# Patient Record
Sex: Male | Born: 1935
Health system: Southern US, Community
[De-identification: ages and names within clinical notes are randomized; demographics above are authoritative.]

## PROBLEM LIST (undated history)

## (undated) DIAGNOSIS — N201 Calculus of ureter: Secondary | ICD-10-CM

## (undated) DIAGNOSIS — M5430 Sciatica, unspecified side: Secondary | ICD-10-CM

## (undated) DIAGNOSIS — E119 Type 2 diabetes mellitus without complications: Secondary | ICD-10-CM

## (undated) DIAGNOSIS — N529 Male erectile dysfunction, unspecified: Secondary | ICD-10-CM

## (undated) DIAGNOSIS — Z860100 Personal history of colon polyps, unspecified: Secondary | ICD-10-CM

## (undated) DIAGNOSIS — G629 Polyneuropathy, unspecified: Secondary | ICD-10-CM

## (undated) DIAGNOSIS — M109 Gout, unspecified: Secondary | ICD-10-CM

## (undated) DIAGNOSIS — R351 Nocturia: Secondary | ICD-10-CM

## (undated) HISTORY — DX: Gout, unspecified: M10.9

## (undated) HISTORY — PX: LUMBAR DISC SURGERY: SHX700

## (undated) HISTORY — DX: Sciatica, unspecified side: M54.30

## (undated) HISTORY — DX: Male erectile dysfunction, unspecified: N52.9

## (undated) HISTORY — DX: Polyneuropathy, unspecified: G62.9

## (undated) HISTORY — PX: COLONOSCOPY: SHX174

---

## 2002-03-26 ENCOUNTER — Ambulatory Visit (HOSPITAL_COMMUNITY): Admission: RE | Admit: 2002-03-26 | Discharge: 2002-03-26 | Payer: Self-pay | Admitting: Internal Medicine

## 2003-01-27 ENCOUNTER — Ambulatory Visit (HOSPITAL_COMMUNITY): Admission: RE | Admit: 2003-01-27 | Discharge: 2003-01-27 | Payer: Self-pay | Admitting: Family Medicine

## 2011-06-06 DIAGNOSIS — G589 Mononeuropathy, unspecified: Secondary | ICD-10-CM | POA: Diagnosis not present

## 2011-06-06 DIAGNOSIS — E1149 Type 2 diabetes mellitus with other diabetic neurological complication: Secondary | ICD-10-CM | POA: Diagnosis not present

## 2011-06-06 DIAGNOSIS — E119 Type 2 diabetes mellitus without complications: Secondary | ICD-10-CM | POA: Diagnosis not present

## 2011-06-06 DIAGNOSIS — M109 Gout, unspecified: Secondary | ICD-10-CM | POA: Diagnosis not present

## 2011-06-09 DIAGNOSIS — Z79899 Other long term (current) drug therapy: Secondary | ICD-10-CM | POA: Diagnosis not present

## 2011-06-09 DIAGNOSIS — I10 Essential (primary) hypertension: Secondary | ICD-10-CM | POA: Diagnosis not present

## 2011-11-03 DIAGNOSIS — D234 Other benign neoplasm of skin of scalp and neck: Secondary | ICD-10-CM | POA: Diagnosis not present

## 2011-11-03 DIAGNOSIS — D0460 Carcinoma in situ of skin of unspecified upper limb, including shoulder: Secondary | ICD-10-CM | POA: Diagnosis not present

## 2011-11-03 DIAGNOSIS — L570 Actinic keratosis: Secondary | ICD-10-CM | POA: Diagnosis not present

## 2011-11-03 DIAGNOSIS — D0439 Carcinoma in situ of skin of other parts of face: Secondary | ICD-10-CM | POA: Diagnosis not present

## 2011-11-03 DIAGNOSIS — D485 Neoplasm of uncertain behavior of skin: Secondary | ICD-10-CM | POA: Diagnosis not present

## 2011-11-03 DIAGNOSIS — D044 Carcinoma in situ of skin of scalp and neck: Secondary | ICD-10-CM | POA: Diagnosis not present

## 2011-11-29 DIAGNOSIS — E119 Type 2 diabetes mellitus without complications: Secondary | ICD-10-CM | POA: Diagnosis not present

## 2011-11-29 DIAGNOSIS — K0510 Chronic gingivitis, plaque induced: Secondary | ICD-10-CM | POA: Diagnosis not present

## 2011-11-29 DIAGNOSIS — I10 Essential (primary) hypertension: Secondary | ICD-10-CM | POA: Diagnosis not present

## 2011-11-29 DIAGNOSIS — Z23 Encounter for immunization: Secondary | ICD-10-CM | POA: Diagnosis not present

## 2011-11-29 DIAGNOSIS — M109 Gout, unspecified: Secondary | ICD-10-CM | POA: Diagnosis not present

## 2012-01-30 DIAGNOSIS — E119 Type 2 diabetes mellitus without complications: Secondary | ICD-10-CM | POA: Diagnosis not present

## 2012-01-30 DIAGNOSIS — H2510 Age-related nuclear cataract, unspecified eye: Secondary | ICD-10-CM | POA: Diagnosis not present

## 2012-01-30 DIAGNOSIS — Z961 Presence of intraocular lens: Secondary | ICD-10-CM | POA: Diagnosis not present

## 2012-02-27 DIAGNOSIS — L570 Actinic keratosis: Secondary | ICD-10-CM | POA: Diagnosis not present

## 2012-04-29 ENCOUNTER — Other Ambulatory Visit: Payer: Self-pay | Admitting: Family Medicine

## 2012-05-15 ENCOUNTER — Other Ambulatory Visit: Payer: Self-pay | Admitting: Family Medicine

## 2012-05-18 ENCOUNTER — Encounter: Payer: Self-pay | Admitting: *Deleted

## 2012-05-20 ENCOUNTER — Ambulatory Visit (INDEPENDENT_AMBULATORY_CARE_PROVIDER_SITE_OTHER): Payer: Medicare Other | Admitting: Family Medicine

## 2012-05-20 ENCOUNTER — Encounter: Payer: Self-pay | Admitting: Family Medicine

## 2012-05-20 VITALS — BP 138/90 | HR 70 | Wt 228.0 lb

## 2012-05-20 DIAGNOSIS — Z79899 Other long term (current) drug therapy: Secondary | ICD-10-CM | POA: Diagnosis not present

## 2012-05-20 DIAGNOSIS — E1149 Type 2 diabetes mellitus with other diabetic neurological complication: Secondary | ICD-10-CM | POA: Diagnosis not present

## 2012-05-20 DIAGNOSIS — N400 Enlarged prostate without lower urinary tract symptoms: Secondary | ICD-10-CM | POA: Diagnosis not present

## 2012-05-20 DIAGNOSIS — I10 Essential (primary) hypertension: Secondary | ICD-10-CM | POA: Diagnosis not present

## 2012-05-20 DIAGNOSIS — E119 Type 2 diabetes mellitus without complications: Secondary | ICD-10-CM | POA: Diagnosis not present

## 2012-05-20 DIAGNOSIS — E1169 Type 2 diabetes mellitus with other specified complication: Secondary | ICD-10-CM | POA: Insufficient documentation

## 2012-05-20 DIAGNOSIS — M109 Gout, unspecified: Secondary | ICD-10-CM

## 2012-05-20 LAB — POCT GLYCOSYLATED HEMOGLOBIN (HGB A1C): Hemoglobin A1C: 6.2

## 2012-05-20 NOTE — Progress Notes (Signed)
  Subjective:    Patient ID: Daniel Mercado, male    DOB: [birth year], 79 y.o.   MRN: [id number]  Diabetes He presents for his follow-up diabetic visit. He has type 2 diabetes mellitus. His disease course has been stable. Pertinent negatives for hypoglycemia include no confusion, dizziness or headaches. Pertinent negatives for diabetes include no blurred vision. Symptoms are stable. Current diabetic treatment includes diet. He is compliant with treatment most of the time. He participates in exercise daily. There is no change in his home blood glucose trend. His breakfast blood glucose is taken between 7-8 am. His breakfast blood glucose range is generally 90-110 mg/dl. He does not see a podiatrist.Eye exam is current.   Patient states no further gout attacks on medication.  Patient feels the Flomax has not helped him much at this point. He also notes a swelling around his eyes which he seems to feel is due to this medication.  Review of Systems  Eyes: Negative for blurred vision.  Neurological: Negative for dizziness and headaches.  Psychiatric/Behavioral: Negative for confusion.   ROS otherwise negative    Objective:   Physical Exam  Results for orders placed in visit on 05/20/12  POCT GLYCOSYLATED HEMOGLOBIN (HGB A1C)      Result Value Range   Hemoglobin A1C 6.2      Alert no acute distress. Vitals reviewed. HEENT normal. Lungs clear. Heart regular rate and rhythm. Feet distal sensation diminished. Pulses good no edema no deformity    Assessment & Plan:  Impression #1 type 2 diabetes overall good control. #2 sensory neuropathy secondary to diabetes. #3 gout no recurrence. #4 prostate hypertrophy discussed. #5 proceed symptoms from Flomax. Plan stop Flomax. Maintain other meds. Diet exercise discussed. Appropriate blood work. Recheck in 6 months.

## 2012-05-27 DIAGNOSIS — I10 Essential (primary) hypertension: Secondary | ICD-10-CM | POA: Diagnosis not present

## 2012-05-27 DIAGNOSIS — Z79899 Other long term (current) drug therapy: Secondary | ICD-10-CM | POA: Diagnosis not present

## 2012-05-27 DIAGNOSIS — E119 Type 2 diabetes mellitus without complications: Secondary | ICD-10-CM | POA: Diagnosis not present

## 2012-05-27 LAB — BASIC METABOLIC PANEL
CO2: 25 mEq/L (ref 19–32)
Calcium: 9.3 mg/dL (ref 8.4–10.5)
Creat: 1.16 mg/dL (ref 0.50–1.35)

## 2012-05-27 LAB — HEPATIC FUNCTION PANEL
AST: 24 U/L (ref 0–37)
Albumin: 3.9 g/dL (ref 3.5–5.2)
Alkaline Phosphatase: 52 U/L (ref 39–117)
Total Bilirubin: 0.7 mg/dL (ref 0.3–1.2)

## 2012-05-27 LAB — LIPID PANEL
HDL: 42 mg/dL (ref >39)
Triglycerides: 59 mg/dL (ref <150)

## 2012-06-02 ENCOUNTER — Encounter: Payer: Self-pay | Admitting: Family Medicine

## 2012-07-29 ENCOUNTER — Other Ambulatory Visit: Payer: Self-pay | Admitting: Family Medicine

## 2012-09-10 ENCOUNTER — Telehealth: Payer: Self-pay | Admitting: Family Medicine

## 2012-09-10 NOTE — Telephone Encounter (Signed)
Needs a new prescription test strips for Easy Max Glucose Meter.  Also needs the lancets for this meter. [employer] he is completely out of Test Strips.  Please call Patient. Thanks

## 2012-09-10 NOTE — Telephone Encounter (Signed)
RX faxed to [facility]. Patient's wife was notified.

## 2012-10-30 ENCOUNTER — Other Ambulatory Visit: Payer: Self-pay | Admitting: Family Medicine

## 2012-11-19 ENCOUNTER — Other Ambulatory Visit: Payer: Self-pay | Admitting: Family Medicine

## 2012-12-04 ENCOUNTER — Other Ambulatory Visit: Payer: Self-pay | Admitting: Family Medicine

## 2012-12-24 ENCOUNTER — Encounter (INDEPENDENT_AMBULATORY_CARE_PROVIDER_SITE_OTHER): Payer: Self-pay | Admitting: *Deleted

## 2012-12-24 ENCOUNTER — Encounter (INDEPENDENT_AMBULATORY_CARE_PROVIDER_SITE_OTHER): Payer: Self-pay

## 2013-01-02 ENCOUNTER — Encounter: Payer: Self-pay | Admitting: Family Medicine

## 2013-01-02 ENCOUNTER — Ambulatory Visit (INDEPENDENT_AMBULATORY_CARE_PROVIDER_SITE_OTHER): Payer: Medicare Other | Admitting: Family Medicine

## 2013-01-02 VITALS — BP 130/86 | Ht 72.0 in | Wt 226.2 lb

## 2013-01-02 DIAGNOSIS — Z23 Encounter for immunization: Secondary | ICD-10-CM | POA: Diagnosis not present

## 2013-01-02 DIAGNOSIS — E119 Type 2 diabetes mellitus without complications: Secondary | ICD-10-CM | POA: Diagnosis not present

## 2013-01-02 DIAGNOSIS — Z0000 Encounter for general adult medical examination without abnormal findings: Secondary | ICD-10-CM

## 2013-01-02 MED ORDER — GLIPIZIDE ER 2.5 MG PO TB24: 2.5000 mg | ORAL_TABLET | Freq: Every day | ORAL | Status: DC

## 2013-01-02 NOTE — Progress Notes (Signed)
Subjective:    Patient ID: Daniel Mercado, male    DOB: March 03, 1931, 79 y.o.   MRN: [id number]  HPI  Patient arrives for his yearly wellness exam. Patient has had no falls and passed mini cog exam.  Just on 100 mg allopurinol and so far working. No further bouts with gallop thankfully.  Claims compliance with medication. Note sugar overall good control. Trying to watch his diet. Compliant with medicines.  Needs a flus hsot.  Results for orders placed in visit on 05/20/12  LIPID PANEL      Result Value Range   Cholesterol 128  0 - 200 mg/dL   Triglycerides 59  <034 mg/dL   HDL 42  >74 mg/dL   Total CHOL/HDL Ratio 3.0     VLDL 12  0 - 40 mg/dL   LDL Cholesterol 74  0 - 99 mg/dL  HEPATIC FUNCTION PANEL      Result Value Range   Total Bilirubin 0.7  0.3 - 1.2 mg/dL   Bilirubin, Direct 0.2  0.0 - 0.3 mg/dL   Indirect Bilirubin 0.5  0.0 - 0.9 mg/dL   Alkaline Phosphatase 52  39 - 117 U/L   AST 24  0 - 37 U/L   ALT 24  0 - 53 U/L   Total Protein 6.8  6.0 - 8.3 g/dL   Albumin 3.9  3.5 - 5.2 g/dL  BASIC METABOLIC PANEL      Result Value Range   Sodium 140  135 - 145 mEq/L   Potassium 4.6  3.5 - 5.3 mEq/L   Chloride 106  96 - 112 mEq/L   CO2 25  19 - 32 mEq/L   Glucose, Bld 106 (*) 70 - 99 mg/dL   BUN 18  6 - 23 mg/dL   Creat 2.59  5.63 - 8.75 mg/dL   Calcium 9.3  8.4 - 64.3 mg/dL  MICROALBUMIN, URINE      Result Value Range   Microalb, Ur 0.53  0.00 - 1.89 mg/dL  POCT GLYCOSYLATED HEMOGLOBIN (HGB A1C)      Result Value Range   Hemoglobin A1C 6.2     Takes diabeta 2.5 q am, has an occasional low sugar spells,    Review of Systems  Constitutional: Negative for fever, activity change and appetite change.  HENT: Negative for congestion and rhinorrhea.   Eyes: Negative for discharge.  Respiratory: Negative for cough and wheezing.   Cardiovascular: Negative for chest pain.  Gastrointestinal: Negative for vomiting, abdominal pain and blood in stool.  Genitourinary:  Negative for frequency and difficulty urinating.  Musculoskeletal: Negative for neck pain.  Skin: Negative for rash.  Allergic/Immunologic: Negative for environmental allergies and food allergies.  Neurological: Negative for weakness and headaches.  Psychiatric/Behavioral: Negative for agitation.       Objective:   Physical Exam  Vitals reviewed. Constitutional: He appears well-developed and well-nourished.  HENT:  Head: Normocephalic and atraumatic.  Right Ear: External ear normal.  Left Ear: External ear normal.  Nose: Nose normal.  Mouth/Throat: Oropharynx is clear and moist.  Eyes: EOM are normal. Pupils are equal, round, and reactive to light.  Neck: Normal range of motion. Neck supple. No thyromegaly present.  Cardiovascular: Normal rate, regular rhythm and normal heart sounds.   No murmur heard. Pulmonary/Chest: Effort normal and breath sounds normal. No respiratory distress. He has no wheezes.  Abdominal: Soft. Bowel sounds are normal. He exhibits no distension and no mass. There is no tenderness.  Genitourinary: Penis normal.  Musculoskeletal: Normal range of motion. He exhibits no edema.  Lymphadenopathy:    He has no cervical adenopathy.  Neurological: He is alert. He exhibits normal muscle tone.  Skin: Skin is warm and dry. No erythema.  Psychiatric: He has a normal mood and affect. His behavior is normal. Judgment normal.          Assessment & Plan:  Impression 1 preventive exam. #2 type 2 diabetes decent control. #3 gout no recurrence plan flu shot. Diet exercise discussed. Encouraged to see eye Dr. Shella Maxim all meds. Check every 6 months. WSL

## 2013-02-03 ENCOUNTER — Other Ambulatory Visit: Payer: Self-pay | Admitting: Family Medicine

## 2013-02-18 DIAGNOSIS — H2510 Age-related nuclear cataract, unspecified eye: Secondary | ICD-10-CM | POA: Diagnosis not present

## 2013-02-18 DIAGNOSIS — Z961 Presence of intraocular lens: Secondary | ICD-10-CM | POA: Diagnosis not present

## 2013-02-18 DIAGNOSIS — E119 Type 2 diabetes mellitus without complications: Secondary | ICD-10-CM | POA: Diagnosis not present

## 2013-02-21 DIAGNOSIS — L570 Actinic keratosis: Secondary | ICD-10-CM | POA: Diagnosis not present

## 2013-02-21 DIAGNOSIS — D239 Other benign neoplasm of skin, unspecified: Secondary | ICD-10-CM | POA: Diagnosis not present

## 2013-07-04 ENCOUNTER — Encounter: Payer: Self-pay | Admitting: Family Medicine

## 2013-07-04 ENCOUNTER — Ambulatory Visit (INDEPENDENT_AMBULATORY_CARE_PROVIDER_SITE_OTHER): Payer: Medicare Other | Admitting: Family Medicine

## 2013-07-04 VITALS — BP 132/90 | Ht 72.0 in | Wt 225.0 lb

## 2013-07-04 DIAGNOSIS — Z125 Encounter for screening for malignant neoplasm of prostate: Secondary | ICD-10-CM

## 2013-07-04 DIAGNOSIS — N399 Disorder of urinary system, unspecified: Secondary | ICD-10-CM | POA: Diagnosis not present

## 2013-07-04 DIAGNOSIS — I10 Essential (primary) hypertension: Secondary | ICD-10-CM

## 2013-07-04 DIAGNOSIS — R3989 Other symptoms and signs involving the genitourinary system: Secondary | ICD-10-CM

## 2013-07-04 DIAGNOSIS — E1149 Type 2 diabetes mellitus with other diabetic neurological complication: Secondary | ICD-10-CM

## 2013-07-04 DIAGNOSIS — Z79899 Other long term (current) drug therapy: Secondary | ICD-10-CM

## 2013-07-04 DIAGNOSIS — Z0189 Encounter for other specified special examinations: Secondary | ICD-10-CM

## 2013-07-04 DIAGNOSIS — N400 Enlarged prostate without lower urinary tract symptoms: Secondary | ICD-10-CM

## 2013-07-04 LAB — POCT URINALYSIS DIPSTICK
PH UA: 6
Spec Grav, UA: 1.02

## 2013-07-04 LAB — POCT GLYCOSYLATED HEMOGLOBIN (HGB A1C): Hemoglobin A1C: 6.6

## 2013-07-04 MED ORDER — GLIPIZIDE ER 2.5 MG PO TB24: 2.5000 mg | ORAL_TABLET | Freq: Every day | ORAL | Status: DC

## 2013-07-04 MED ORDER — ALLOPURINOL 100 MG PO TABS: ORAL_TABLET | ORAL | Status: DC

## 2013-07-04 NOTE — Progress Notes (Signed)
   Subjective:    Patient ID: Daniel Mercado, male    DOB: Oct 30, 1931, 80 y.o.   MRN: [id number]  Diabetes He presents for his follow-up diabetic visit. He has type 2 diabetes mellitus. Hypoglycemia symptoms include nervousness/anxiousness and tremors. There are no diabetic associated symptoms. He is compliant with treatment all of the time. He participates in exercise daily. He monitors blood glucose at home 1-2 x per week. His overall blood glucose range is 110-130 mg/dl. He does not see a podiatrist.Eye exam is current.   patient's diabetic neuropathy is stable. Some numbness in his feet.  No further episodes of gallops. Compliant with his medication. He has cut down to just one allopurinol a day and still voiding the gout.  Patient reports trouble urinating. Up to 3 times per night. We tried Flomax once in the past but just 0.4 mg every one up so full dose. Brother has a history of bladder cancer this makes him somewhat and see Patient is having urinary problems. He said he is having difficulty starting, stopping, and with the stream. ,  Progressing over time     Results for orders placed in visit on 07/04/13  POCT GLYCOSYLATED HEMOGLOBIN (HGB A1C)      Result Value Ref Range   Hemoglobin A1C 6.6     This mo has had freq difficulty with urination  Has trouble starting  Review of Systems  Neurological: Positive for tremors.  Psychiatric/Behavioral: The patient is nervous/anxious.    no chest pain Baseline arthritis no abdominal pain no change in bowel habit no blood in urine ROS otherwise negative     Objective:   Physical Exam Alert vitals stable no acute distress. HEENT normal. Lungs clear. Heart regular in rhythm. Abdomen benign. Prostate diffusely enlarged consistency within normal limits testicles normal no hernia  Urinalysis no hematuria no white blood cells present no bacteria. Next  Sent for blood work.       Assessment & Plan:  Impression 1 type 2 diabetes good  control #2 stable diabetic neuropathy. #3 graft gout clinically stable. #4 nocturia worsening along with daytime hesitancy likely progressive prostate hypertrophy. If we can get a normal PSA we'll press on Flomax workup to full dose. Patient reports glucose levels good. Compliant with diabetes medicine. Plan as noted above plus blood work plus orders followup as scheduled. Diet exercise discussed WSL

## 2013-07-05 LAB — LIPID PANEL
CHOL/HDL RATIO: 4 ratio
Cholesterol: 160 mg/dL (ref 0–200)
HDL: 40 mg/dL (ref >39)
LDL CALC: 103 mg/dL — AB (ref 0–99)
TRIGLYCERIDES: 87 mg/dL (ref <150)
VLDL: 17 mg/dL (ref 0–40)

## 2013-07-05 LAB — BASIC METABOLIC PANEL
BUN: 15 mg/dL (ref 6–23)
CHLORIDE: 103 meq/L (ref 96–112)
CO2: 28 meq/L (ref 19–32)
Calcium: 9.3 mg/dL (ref 8.4–10.5)
Creat: 1.13 mg/dL (ref 0.50–1.35)
GLUCOSE: 118 mg/dL — AB (ref 70–99)
POTASSIUM: 4.4 meq/L (ref 3.5–5.3)
SODIUM: 138 meq/L (ref 135–145)

## 2013-07-05 LAB — HEPATIC FUNCTION PANEL
ALT: 27 U/L (ref 0–53)
AST: 29 U/L (ref 0–37)
Albumin: 4 g/dL (ref 3.5–5.2)
Alkaline Phosphatase: 50 U/L (ref 39–117)
BILIRUBIN DIRECT: 0.2 mg/dL (ref 0.0–0.3)
BILIRUBIN INDIRECT: 0.6 mg/dL (ref 0.2–1.2)
BILIRUBIN TOTAL: 0.8 mg/dL (ref 0.2–1.2)
Total Protein: 6.8 g/dL (ref 6.0–8.3)

## 2013-07-05 LAB — MICROALBUMIN, URINE: Microalb, Ur: 0.5 mg/dL (ref 0.00–1.89)

## 2013-07-07 LAB — PSA, MEDICARE: PSA: 1.2 ng/mL (ref <=4.00)

## 2013-07-10 ENCOUNTER — Encounter: Payer: Self-pay | Admitting: Family Medicine

## 2013-11-18 ENCOUNTER — Ambulatory Visit: Payer: Medicare Other | Admitting: *Deleted

## 2013-11-18 ENCOUNTER — Ambulatory Visit (INDEPENDENT_AMBULATORY_CARE_PROVIDER_SITE_OTHER): Payer: Medicare Other | Admitting: *Deleted

## 2013-11-18 DIAGNOSIS — Z23 Encounter for immunization: Secondary | ICD-10-CM

## 2014-01-01 ENCOUNTER — Ambulatory Visit (INDEPENDENT_AMBULATORY_CARE_PROVIDER_SITE_OTHER): Payer: Medicare Other | Admitting: Family Medicine

## 2014-01-01 ENCOUNTER — Encounter: Payer: Self-pay | Admitting: Family Medicine

## 2014-01-01 VITALS — BP 138/92 | Ht 72.0 in | Wt 222.0 lb

## 2014-01-01 DIAGNOSIS — M1000 Idiopathic gout, unspecified site: Secondary | ICD-10-CM

## 2014-01-01 DIAGNOSIS — I10 Essential (primary) hypertension: Secondary | ICD-10-CM

## 2014-01-01 DIAGNOSIS — N400 Enlarged prostate without lower urinary tract symptoms: Secondary | ICD-10-CM | POA: Diagnosis not present

## 2014-01-01 DIAGNOSIS — E119 Type 2 diabetes mellitus without complications: Secondary | ICD-10-CM | POA: Diagnosis not present

## 2014-01-01 DIAGNOSIS — Z23 Encounter for immunization: Secondary | ICD-10-CM

## 2014-01-01 LAB — POCT GLYCOSYLATED HEMOGLOBIN (HGB A1C): Hemoglobin A1C: 6.4

## 2014-01-01 MED ORDER — TAMSULOSIN HCL 0.4 MG PO CAPS: 0.4000 mg | ORAL_CAPSULE | Freq: Every day | ORAL | Status: DC

## 2014-01-01 NOTE — Progress Notes (Signed)
   Subjective:    Patient ID: Daniel Mercado, male    DOB: Jun 25, 1931, 80 y.o.   MRN: [id number]  Diabetes He presents for his follow-up diabetic visit. He has type 2 diabetes mellitus. Current diabetic treatment includes oral agent (monotherapy) (glipizide). He is compliant with treatment all of the time. He never participates in exercise. His breakfast blood glucose range is generally 90-110 mg/dl. He does not see a podiatrist.Eye exam is current.   Pt states no concerns today.  Urinating freq during the day, trouble going first thing tin the morn . Nocturia not so much a problem.  History of hypertension. Patient watching salt intake.  History of gout. Clinically stable at this time. No recurrence on the allopurinol.  Review of Systems No headache no chest pain and back pain abdominal pain no change in bowel habits no blood in stool    Objective:   Physical Exam  Alert no acute distress vital stable HEENT normal. Lungs clear. Heart regular in rhythm. Ankles without edema. She diabetic foot exam      Assessment & Plan:  Impression #1 type 2 diabetes good control. #2 hypertension good control. #3 gout clinically stable. #4 increasing urinary frequency discuss plan trial Flomax. Rationale discussed. Pneumonia vaccine today. Maintain other medications. Diet exercise discussed recheck in 6 months. WSL

## 2014-01-17 DIAGNOSIS — J029 Acute pharyngitis, unspecified: Secondary | ICD-10-CM | POA: Diagnosis not present

## 2014-01-17 DIAGNOSIS — H10023 Other mucopurulent conjunctivitis, bilateral: Secondary | ICD-10-CM | POA: Diagnosis not present

## 2014-01-17 DIAGNOSIS — J019 Acute sinusitis, unspecified: Secondary | ICD-10-CM | POA: Diagnosis not present

## 2014-01-19 ENCOUNTER — Other Ambulatory Visit: Payer: Self-pay | Admitting: Family Medicine

## 2014-02-16 ENCOUNTER — Other Ambulatory Visit: Payer: Self-pay | Admitting: Family Medicine

## 2014-02-24 DIAGNOSIS — Z961 Presence of intraocular lens: Secondary | ICD-10-CM | POA: Diagnosis not present

## 2014-02-24 DIAGNOSIS — H2511 Age-related nuclear cataract, right eye: Secondary | ICD-10-CM | POA: Diagnosis not present

## 2014-02-24 DIAGNOSIS — E119 Type 2 diabetes mellitus without complications: Secondary | ICD-10-CM | POA: Diagnosis not present

## 2014-02-24 LAB — HM DIABETES EYE EXAM

## 2014-03-11 ENCOUNTER — Encounter: Payer: Self-pay | Admitting: *Deleted

## 2014-03-24 ENCOUNTER — Ambulatory Visit (INDEPENDENT_AMBULATORY_CARE_PROVIDER_SITE_OTHER): Payer: Medicare Other | Admitting: Orthopedic Surgery

## 2014-03-24 ENCOUNTER — Ambulatory Visit (INDEPENDENT_AMBULATORY_CARE_PROVIDER_SITE_OTHER): Payer: Medicare Other

## 2014-03-24 ENCOUNTER — Encounter: Payer: Self-pay | Admitting: Orthopedic Surgery

## 2014-03-24 VITALS — BP 149/94 | Ht 72.0 in | Wt 222.0 lb

## 2014-03-24 DIAGNOSIS — M129 Arthropathy, unspecified: Secondary | ICD-10-CM

## 2014-03-24 DIAGNOSIS — M25511 Pain in right shoulder: Secondary | ICD-10-CM

## 2014-03-24 DIAGNOSIS — M19011 Primary osteoarthritis, right shoulder: Secondary | ICD-10-CM

## 2014-03-24 NOTE — Patient Instructions (Signed)
Call the office back if you change your mind about shoulder surgery

## 2014-03-24 NOTE — Progress Notes (Signed)
Patient ID: Daniel Mercado, male   DOB: 1931/09/02, 81 y.o.   MRN: [id number]  Chief Complaint  Patient presents with  . Shoulder Pain    right shoulder pain, no known injury     Daniel Mercado is a 81 y.o. male.   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 Sciatica   . Gout   . Diabetes mellitus without complication   . Peripheral neuropathy   . Erectile dysfunction   . Hypertension     Past Surgical History  Procedure Laterality Date  . Back surgery  1967  . Lumbar disc surgery  1995  . Colonoscopy  12/09    polyps due dec 2014    Family History  Problem Relation Age of Onset  . Diabetes Other     Social History History  Substance Use Topics  . Smoking status: Never Smoker   . Smokeless tobacco: Current User    Types: Chew  . Alcohol Use: Not on file    No Known Allergies  Current Outpatient Prescriptions  Medication Sig Dispense Refill  . allopurinol (ZYLOPRIM) 100 MG tablet TAKE TWO TABLETS BY MOUTH ONCE DAILY 30 tablet 5  . aspirin 81 MG tablet Take 81 mg by mouth daily.    . Chlorphen-Pseudoephed-APAP (SINE-OFF PO) Take by mouth at bedtime as needed.    Marland Kitchen glipiZIDE (GLUCOTROL XL) 2.5 MG 24 hr tablet TAKE ONE TABLET BY MOUTH ONCE DAILY WITH BREAKFAST 30 tablet 2  . polyethylene glycol powder (MIRALAX) powder Take 1 Container by mouth as needed.    . tamsulosin (FLOMAX) 0.4 MG CAPS capsule Take 1 capsule (0.4 mg total) by mouth daily. 30 capsule 5   No current facility-administered medications for this visit.       Physical Exam Blood  pressure 149/94, height 6' (1.829 m), weight 222 lb (100.699 kg). Physical Exam The patient is well developed well nourished and well groomed. Orientation to person place and time is normal  Mood is pleasant. Ambulatory status normal Left shoulder full range of motion normal strength stability and alignment skin normal neurovascular exam intact  Right shoulder shows 45 of external rotation with his arm at his side he only has 45 of active abduction and 45 of active forward elevation internal rotation to the front pocket passive range of motion reveals 80 of forward elevation in the scapular plane. Internal/external rotation rotator cuff strength intact supraspinatus I could not really assess it appears that he is using a lot of scapular substitution to raise his arm to 45-60 skin is intact pulses are good sensation is normal he has no lymphadenopathy on the right side  Data Reviewed Independent x-ray interpretation of the films repeated today show severe glenohumeral arthritis  Assessment He will need a shoulder replacement he did not want an injection he says he can get by at this point Plan He will call us if he changes his mind about shoulder replacement surgery. We would need to evaluate his rotator cuff by MRI prior to surgery to determine if he needs a reverse replacement  or a standard total shoulder.

## 2014-04-01 ENCOUNTER — Other Ambulatory Visit: Payer: Self-pay | Admitting: Family Medicine

## 2014-04-01 ENCOUNTER — Other Ambulatory Visit: Payer: Self-pay | Admitting: *Deleted

## 2014-04-01 MED ORDER — ALLOPURINOL 100 MG PO TABS: 200.0000 mg | ORAL_TABLET | Freq: Every day | ORAL | Status: DC

## 2014-05-18 ENCOUNTER — Other Ambulatory Visit: Payer: Self-pay | Admitting: Family Medicine

## 2014-06-16 ENCOUNTER — Other Ambulatory Visit: Payer: Self-pay | Admitting: Family Medicine

## 2014-06-16 NOTE — Telephone Encounter (Signed)
Needs office visit.

## 2014-06-29 ENCOUNTER — Ambulatory Visit (INDEPENDENT_AMBULATORY_CARE_PROVIDER_SITE_OTHER): Payer: Medicare Other | Admitting: Family Medicine

## 2014-06-29 ENCOUNTER — Other Ambulatory Visit: Payer: Self-pay | Admitting: *Deleted

## 2014-06-29 ENCOUNTER — Encounter: Payer: Self-pay | Admitting: Family Medicine

## 2014-06-29 VITALS — BP 138/92 | Ht 72.0 in | Wt 216.0 lb

## 2014-06-29 DIAGNOSIS — E119 Type 2 diabetes mellitus without complications: Secondary | ICD-10-CM

## 2014-06-29 LAB — POCT GLYCOSYLATED HEMOGLOBIN (HGB A1C): Hemoglobin A1C: 6.1

## 2014-06-29 MED ORDER — TAMSULOSIN HCL 0.4 MG PO CAPS: ORAL_CAPSULE | ORAL | Status: DC

## 2014-06-29 MED ORDER — GLIPIZIDE ER 2.5 MG PO TB24: 2.5000 mg | ORAL_TABLET | Freq: Every day | ORAL | Status: DC

## 2014-06-29 MED ORDER — TAMSULOSIN HCL 0.4 MG PO CAPS: 0.4000 mg | ORAL_CAPSULE | Freq: Every day | ORAL | Status: DC

## 2014-06-29 MED ORDER — ALLOPURINOL 100 MG PO TABS: 200.0000 mg | ORAL_TABLET | Freq: Every day | ORAL | Status: DC

## 2014-06-29 NOTE — Progress Notes (Signed)
   Subjective:    Patient ID: Daniel Mercado, male    DOB: 1931/11/28, 81 y.o.   MRN: [id number]  Diabetes He presents for his follow-up diabetic visit. He has type 2 diabetes mellitus. He is following a generally unhealthy diet. He participates in exercise daily. His breakfast blood glucose range is generally 90-110 mg/dl. He does not see a podiatrist.Eye exam is current (02/2014 ).  A1C today 6.1.    pt states he has problems hearing for the past 4 -5 years. Hearing test done today. Pt failed.    Needs refills on all meds and test strips.    Review of Systems     Objective:   Physical Exam        Assessment & Plan:

## 2014-09-23 ENCOUNTER — Other Ambulatory Visit: Payer: Self-pay | Admitting: *Deleted

## 2014-09-23 MED ORDER — TAMSULOSIN HCL 0.4 MG PO CAPS: ORAL_CAPSULE | ORAL | Status: DC

## 2014-10-09 ENCOUNTER — Emergency Department (HOSPITAL_COMMUNITY): Payer: Medicare Other

## 2014-10-09 ENCOUNTER — Emergency Department (HOSPITAL_COMMUNITY)
Admission: EM | Admit: 2014-10-09 | Discharge: 2014-10-09 | Disposition: A | Discharge status: Home / Self Care | Payer: Medicare Other | Attending: Emergency Medicine | Admitting: Emergency Medicine

## 2014-10-09 ENCOUNTER — Encounter (HOSPITAL_COMMUNITY): Payer: Self-pay | Admitting: *Deleted

## 2014-10-09 DIAGNOSIS — N132 Hydronephrosis with renal and ureteral calculous obstruction: Secondary | ICD-10-CM | POA: Diagnosis not present

## 2014-10-09 DIAGNOSIS — E119 Type 2 diabetes mellitus without complications: Secondary | ICD-10-CM | POA: Diagnosis not present

## 2014-10-09 DIAGNOSIS — Z8669 Personal history of other diseases of the nervous system and sense organs: Secondary | ICD-10-CM | POA: Insufficient documentation

## 2014-10-09 DIAGNOSIS — Z79899 Other long term (current) drug therapy: Secondary | ICD-10-CM | POA: Insufficient documentation

## 2014-10-09 DIAGNOSIS — I10 Essential (primary) hypertension: Secondary | ICD-10-CM | POA: Diagnosis not present

## 2014-10-09 DIAGNOSIS — Z87438 Personal history of other diseases of male genital organs: Secondary | ICD-10-CM | POA: Diagnosis not present

## 2014-10-09 DIAGNOSIS — Z7982 Long term (current) use of aspirin: Secondary | ICD-10-CM | POA: Diagnosis not present

## 2014-10-09 DIAGNOSIS — M109 Gout, unspecified: Secondary | ICD-10-CM | POA: Insufficient documentation

## 2014-10-09 DIAGNOSIS — N200 Calculus of kidney: Secondary | ICD-10-CM | POA: Diagnosis not present

## 2014-10-09 DIAGNOSIS — R109 Unspecified abdominal pain: Secondary | ICD-10-CM | POA: Diagnosis present

## 2014-10-09 DIAGNOSIS — R110 Nausea: Secondary | ICD-10-CM | POA: Diagnosis not present

## 2014-10-09 LAB — URINALYSIS, ROUTINE W REFLEX MICROSCOPIC
Bilirubin Urine: NEGATIVE
Glucose, UA: NEGATIVE mg/dL
Ketones, ur: NEGATIVE mg/dL
LEUKOCYTES UA: NEGATIVE
NITRITE: NEGATIVE
Protein, ur: NEGATIVE mg/dL
Specific Gravity, Urine: 1.025 (ref 1.005–1.030)
UROBILINOGEN UA: 0.2 mg/dL (ref 0.0–1.0)
pH: 5.5 (ref 5.0–8.0)

## 2014-10-09 LAB — COMPREHENSIVE METABOLIC PANEL
ALBUMIN: 4.4 g/dL (ref 3.5–5.0)
ALK PHOS: 58 U/L (ref 38–126)
ALT: 21 U/L (ref 17–63)
ANION GAP: 9 (ref 5–15)
AST: 28 U/L (ref 15–41)
BUN: 23 mg/dL — ABNORMAL HIGH (ref 6–20)
CO2: 24 mmol/L (ref 22–32)
Calcium: 9.4 mg/dL (ref 8.9–10.3)
Chloride: 108 mmol/L (ref 101–111)
Creatinine, Ser: 1.48 mg/dL — ABNORMAL HIGH (ref 0.61–1.24)
GFR calc Af Amer: 50 mL/min — ABNORMAL LOW (ref >60)
GFR calc non Af Amer: 43 mL/min — ABNORMAL LOW (ref >60)
GLUCOSE: 170 mg/dL — AB (ref 65–99)
POTASSIUM: 4.1 mmol/L (ref 3.5–5.1)
Sodium: 141 mmol/L (ref 135–145)
Total Bilirubin: 0.7 mg/dL (ref 0.3–1.2)
Total Protein: 8 g/dL (ref 6.5–8.1)

## 2014-10-09 LAB — CBC WITH DIFFERENTIAL/PLATELET
BASOS PCT: 0 %
Basophils Absolute: 0 10*3/uL (ref 0.0–0.1)
EOS ABS: 0.1 10*3/uL (ref 0.0–0.7)
Eosinophils Relative: 1 %
HCT: 43.7 % (ref 39.0–52.0)
HEMOGLOBIN: 15.3 g/dL (ref 13.0–17.0)
Lymphocytes Relative: 25 %
Lymphs Abs: 2.2 10*3/uL (ref 0.7–4.0)
MCH: 32.4 pg (ref 26.0–34.0)
MCHC: 35 g/dL (ref 30.0–36.0)
MCV: 92.6 fL (ref 78.0–100.0)
MONOS PCT: 8 %
Monocytes Absolute: 0.7 10*3/uL (ref 0.1–1.0)
NEUTROS PCT: 66 %
Neutro Abs: 5.8 10*3/uL (ref 1.7–7.7)
Platelets: 190 10*3/uL (ref 150–400)
RBC: 4.72 MIL/uL (ref 4.22–5.81)
RDW: 13.6 % (ref 11.5–15.5)
WBC: 8.8 10*3/uL (ref 4.0–10.5)

## 2014-10-09 LAB — URINE MICROSCOPIC-ADD ON

## 2014-10-09 LAB — LIPASE, BLOOD: Lipase: 21 U/L — ABNORMAL LOW (ref 22–51)

## 2014-10-09 LAB — I-STAT CG4 LACTIC ACID, ED: LACTIC ACID, VENOUS: 1.07 mmol/L (ref 0.5–2.0)

## 2014-10-09 MED ORDER — SODIUM CHLORIDE 0.9 % IV BOLUS (SEPSIS)
1000.0000 mL | Freq: Once | INTRAVENOUS | Status: AC
  Administered 2014-10-09: 1000 mL via INTRAVENOUS

## 2014-10-09 MED ORDER — OXYCODONE HCL 5 MG PO TABS: 5.0000 mg | ORAL_TABLET | ORAL | Status: DC | PRN

## 2014-10-09 MED ORDER — MORPHINE SULFATE (PF) 4 MG/ML IV SOLN
4.0000 mg | Freq: Once | INTRAVENOUS | Status: AC
  Administered 2014-10-09: 4 mg via INTRAVENOUS
  Filled 2014-10-09: qty 1

## 2014-10-09 MED ORDER — ONDANSETRON HCL 4 MG/2ML IJ SOLN
4.0000 mg | Freq: Once | INTRAMUSCULAR | Status: AC
  Administered 2014-10-09: 4 mg via INTRAVENOUS
  Filled 2014-10-09: qty 2

## 2014-10-09 MED ORDER — ONDANSETRON 4 MG PO TBDP: ORAL_TABLET | ORAL | Status: DC

## 2014-10-09 MED ORDER — TAMSULOSIN HCL 0.4 MG PO CAPS: 0.4000 mg | ORAL_CAPSULE | Freq: Every day | ORAL | Status: DC

## 2014-10-09 NOTE — ED Notes (Signed)
Left flank pain began this morning with nausea. Vomited x 1 in triage. States he woke up 3 times during the night having to urinate which is not normal for him.

## 2014-10-09 NOTE — Discharge Instructions (Signed)
Take 4 over the counter ibuprofen tablets 3 times a day or 2 over-the-counter naproxen tablets twice a day for pain.  Kidney Stones Kidney stones (urolithiasis) are solid masses that form inside your kidneys. The intense pain is caused by the stone moving through the kidney, ureter, bladder, and urethra (urinary tract). When the stone moves, the ureter starts to spasm around the stone. The stone is usually passed in your pee (urine).  HOME CARE  Drink enough fluids to keep your pee clear or pale yellow. This helps to get the stone out.  Strain all pee through the provided strainer. Do not pee without peeing through the strainer, not even once. If you pee the stone out, catch it in the strainer. The stone may be as small as a grain of salt. Take this to your doctor. This will help your doctor figure out what you can do to try to prevent more kidney stones.  Only take medicine as told by your doctor.  Follow up with your doctor as told.  Get follow-up X-rays as told by your doctor. GET HELP IF: You have pain that gets worse even if you have been taking pain medicine. GET HELP RIGHT AWAY IF:   Your pain does not get better with medicine.  You have a fever or shaking chills.  Your pain increases and gets worse over 18 hours.  You have new belly (abdominal) pain.  You feel faint or pass out.  You are unable to pee. MAKE SURE YOU:   Understand these instructions.  Will watch your condition.  Will get help right away if you are not doing well or get worse. Document Released: 06/21/2007 Document Revised: 09/04/2012 Document Reviewed: 06/05/2012 [facility] Patient Information 2015 [place], [place]. This information is not intended to replace advice given to you by your health care provider. Make sure you discuss any questions you have with your health care provider.

## 2014-10-09 NOTE — ED Provider Notes (Signed)
CSN: [id number]     Arrival date & time 10/09/14  1529 History   First MD Initiated Contact with Patient 10/09/14 1539     Chief Complaint  Patient presents with  . Flank Pain     (Consider location/radiation/quality/duration/timing/severity/associated sxs/prior Treatment) Patient is a 81 y.o. male presenting with flank pain. The history is provided by the patient.  Flank Pain This is a new problem. The current episode started 12 to 24 hours ago. The problem occurs constantly. The problem has not changed since onset.Pertinent negatives include no chest pain, no abdominal pain, no headaches and no shortness of breath. Nothing aggravates the symptoms. Nothing relieves the symptoms. He has tried nothing for the symptoms. The treatment provided no relief.   81 yo M with a chief complaint left flank pain. The started this morning. Patient woke up with this pain. Constant sharp pain. Denies radiation. Nothing seems to make it better or worse. Associated with nausea. Denies fevers or chills. Denies dysuria or increased frequency hematuria. Denies pain in his penis or testicles. Denies rectal tenderness. Denies history of nephrolithiasis.   Past Medical History  Diagnosis Date  . Sciatica   . Gout   . Diabetes mellitus without complication   . Peripheral neuropathy   . Erectile dysfunction   . Hypertension    Past Surgical History  Procedure Laterality Date  . Back surgery  1967  . Lumbar disc surgery  1995  . Colonoscopy  12/09    polyps due dec 2014   Family History  Problem Relation Age of Onset  . Diabetes Other    Social History  Substance Use Topics  . Smoking status: Never Smoker   . Smokeless tobacco: Current User    Types: Chew  . Alcohol Use: None    Review of Systems  Constitutional: Negative for fever and chills.  HENT: Negative for congestion and facial swelling.   Eyes: Negative for discharge and visual disturbance.  Respiratory: Negative for shortness of breath.    Cardiovascular: Negative for chest pain and palpitations.  Gastrointestinal: Positive for nausea. Negative for vomiting, abdominal pain and diarrhea.  Genitourinary: Positive for flank pain.  Musculoskeletal: Negative for myalgias and arthralgias.  Skin: Negative for color change and rash.  Neurological: Negative for tremors, syncope and headaches.  Psychiatric/Behavioral: Negative for confusion and dysphoric mood.      Allergies  Review of patient's allergies indicates no known allergies.  Home Medications   Prior to Admission medications   Medication Sig Start Date End Date Taking? Authorizing Provider  allopurinol (ZYLOPRIM) 100 MG tablet Take 2 tablets (200 mg total) by mouth daily. Patient taking differently: Take 100 mg by mouth daily.  06/29/14  Yes Mikey Kirschner, MD  aspirin 81 MG tablet Take 81 mg by mouth daily.   Yes Historical Provider, MD  glipiZIDE (GLUCOTROL XL) 2.5 MG 24 hr tablet Take 1 tablet (2.5 mg total) by mouth daily with breakfast. 06/29/14  Yes Mikey Kirschner, MD  polyethylene glycol powder (MIRALAX) powder Take 17 g by mouth daily as needed for mild constipation or moderate constipation.    Yes Historical Provider, MD  tamsulosin (FLOMAX) 0.4 MG CAPS capsule Take two tablets daily Patient taking differently: Take 0.8 mg by mouth daily.  09/23/14  Yes Mikey Kirschner, MD  ondansetron (ZOFRAN ODT) 4 MG disintegrating tablet 4mg  ODT q4 hours prn nausea/vomit 10/09/14   Deno Etienne, DO  oxyCODONE (ROXICODONE) 5 MG immediate release tablet Take 1 tablet (5 mg  total) by mouth every 4 (four) hours as needed for severe pain. 10/09/14   Deno Etienne, DO  tamsulosin (FLOMAX) 0.4 MG CAPS capsule Take 1 capsule (0.4 mg total) by mouth daily after supper. 10/09/14   Deno Etienne, DO   BP 163/104 mmHg  Pulse 96  Temp(Src) 97.5 F (36.4 C) (Oral)  Resp 18  Ht 6\' 1"  (1.854 m)  Wt 216 lb (97.977 kg)  BMI 28.50 kg/m2  SpO2 94% Physical Exam  Constitutional: He is oriented to  person, place, and time. He appears well-developed and well-nourished.  HENT:  Head: Normocephalic and atraumatic.  Eyes: EOM are normal. Pupils are equal, round, and reactive to light.  Neck: Normal range of motion. Neck supple. No JVD present.  Cardiovascular: Normal rate and regular rhythm.  Exam reveals no gallop and no friction rub.   No murmur heard. Pulmonary/Chest: No respiratory distress. He has no wheezes.  Abdominal: He exhibits no distension. There is tenderness (tender palpation about the left CVA). There is no rebound and no guarding.  Musculoskeletal: Normal range of motion.  Neurological: He is alert and oriented to person, place, and time.  Skin: No rash noted. No pallor.  Psychiatric: He has a normal mood and affect. His behavior is normal.    ED Course  Procedures (including critical care time) Labs Review Labs Reviewed  COMPREHENSIVE METABOLIC PANEL - Abnormal; Notable for the following:    Glucose, Bld 170 (*)    BUN 23 (*)    Creatinine, Ser 1.48 (*)    GFR calc non Af Amer 43 (*)    GFR calc Af Amer 50 (*)    All other components within normal limits  LIPASE, BLOOD - Abnormal; Notable for the following:    Lipase 21 (*)    All other components within normal limits  URINALYSIS, ROUTINE W REFLEX MICROSCOPIC (NOT AT [facility]) - Abnormal; Notable for the following:    Hgb urine dipstick LARGE (*)    All other components within normal limits  URINE MICROSCOPIC-ADD ON - Abnormal; Notable for the following:    Bacteria, UA MANY (*)    All other components within normal limits  URINE CULTURE  CBC WITH DIFFERENTIAL/PLATELET  I-STAT CG4 LACTIC ACID, ED    Imaging Review Ct Renal Stone Study  10/09/2014   CLINICAL DATA:  Left-sided flank pain and nausea since this morning.  EXAM: CT ABDOMEN AND PELVIS WITHOUT CONTRAST  TECHNIQUE: Multidetector CT imaging of the abdomen and pelvis was performed following the standard protocol without IV contrast.  COMPARISON:  None.   FINDINGS: There is at least partially obstructing 7.3 mm calculus within the proximal to mid left ureter, with mild dilation of the left ureter and mild left hydronephrosis. Perirenal left fat stranding is also seen, however it is not significantly different from the counterpart.  No intrarenal or proximal ureteral calculi on the right side. No evidence of hydronephrosis or other secondary signs of upper urinary tract obstruction. Within limits of unenhanced technique, normal appearing right kidney.  Again, within limits of unenhanced technique, remaining visualized upper abdomen unremarkable. Visualized extreme lung bases clear.  No distal ureteral calculi on either side.  Evaluation of the osseous structures demonstrates compression deformity of L2 vertebral body with approximately 40% height loss. The attenuation of the bony matrix of L2 is heterogeneous, which may represent a presence of osseous hemangioma or a lytic lesion. Osteoarthritic changes of the lower lumbosacral spine as seen.  There is partially visualized bilateral fat  containing indirect inguinal hernias, and right more than left hydrocele  IMPRESSION: At least partially obstructing 7.3 mm calculus within the proximal to mid left ureter, with associated mild left hydroureter and left hydronephrosis.  No evidence of nephrolithiasis.  Compression deformity of L2 vertebral body, with presence of osseous hemangioma or lytic lesion within it. Clinical correlation is recommended. If concern for malignancy, bone scan may be considered.  Bilateral hydrocele.  Bilateral fat containing indirect inguinal hernias.   Electronically Signed   By: Fidela Salisbury M.D.   On: 10/09/2014 16:36   I have personally reviewed and evaluated these images and lab results as part of my medical decision-making.   EKG Interpretation None      MDM   Final diagnoses:  Nephrolithiasis    81 yo M with a chief complaint of left flank pain. No history of stones  previously. Will CT stone study.  7.3 mm stone. Partially obstructing. Patient given second round of pain medicine. Awaiting UA.  Bacteria but no other signs of infection. Discussed with urology Dr. Risa Grill, recommended follow-up in urology clinic. No antibiotics needed at this time.   I have discussed the diagnosis/risks/treatment options with the patient and family and believe the pt to be eligible for discharge home to follow-up with Urology. We also discussed returning to the ED immediately if new or worsening sx occur. We discussed the sx which are most concerning (e.g., fever, uncontrolled pain) that necessitate immediate return. Medications administered to the patient during their visit and any new prescriptions provided to the patient are listed below.  Medications given during this visit Medications  morphine 4 MG/ML injection 4 mg (4 mg Intravenous Given 10/09/14 1550)  ondansetron (ZOFRAN) injection 4 mg (4 mg Intravenous Given 10/09/14 1550)  sodium chloride 0.9 % bolus 1,000 mL (0 mLs Intravenous Stopped 10/09/14 1731)  morphine 4 MG/ML injection 4 mg (4 mg Intravenous Given 10/09/14 1658)  sodium chloride 0.9 % bolus 1,000 mL (0 mLs Intravenous Stopped 10/09/14 1923)    Discharge Medication List as of 10/09/2014  7:13 PM    START taking these medications   Details  ondansetron (ZOFRAN ODT) 4 MG disintegrating tablet 4mg  ODT q4 hours prn nausea/vomit, Print    oxyCODONE (ROXICODONE) 5 MG immediate release tablet Take 1 tablet (5 mg total) by mouth every 4 (four) hours as needed for severe pain., Starting 10/09/2014, Until Discontinued, Print    !! tamsulosin (FLOMAX) 0.4 MG CAPS capsule Take 1 capsule (0.4 mg total) by mouth daily after supper., Starting 10/09/2014, Until Discontinued, Print     !! - Potential duplicate medications found. Please discuss with provider.       The patient appears reasonably screen and/or stabilized for discharge and I doubt any other medical  condition or other [facility] requiring further screening, evaluation, or treatment in the ED at this time prior to discharge.    Deno Etienne, DO 10/09/14 2359

## 2014-10-09 NOTE — ED Notes (Signed)
Pt is unable to provide urine sample at this time. 

## 2014-10-12 DIAGNOSIS — N201 Calculus of ureter: Secondary | ICD-10-CM | POA: Diagnosis not present

## 2014-10-13 ENCOUNTER — Other Ambulatory Visit: Payer: Self-pay | Admitting: Urology

## 2014-10-16 LAB — URINE CULTURE

## 2014-10-30 ENCOUNTER — Encounter (HOSPITAL_BASED_OUTPATIENT_CLINIC_OR_DEPARTMENT_OTHER): Payer: Self-pay | Admitting: *Deleted

## 2014-10-30 NOTE — Progress Notes (Addendum)
NPO AFTER MN, PT VERBALIZED UNDERSTANDING THIS INCLUDED NO CHEW TOBACCO.  ARRIVE AT 0900. NEEDS ISTAT AND EKG.  WILL TAKE ALLOPURINOL AND FLOMAX AM DOS W/ SIPS OF WATER . AND IF NEEDED TAKE ZOFRAN/ OXYCODONE.

## 2014-11-05 NOTE — H&P (Signed)
History of Present Illness Consultation for left ureteral stone referred by Dr. Tyrone Nine. PCP Dr. Wolfgang Phoenix.    1-left ureteral stone-patient was seen a few days ago by the emergency department with left flank pain. CT scan revealed a 5-6 mm left proximal ureteral stone with mild proximal hydroureteronephrosis. It was difficult to see the stone on the scout. SSD 13 cm. HU 750. UA showed many bacteria and 2 numerous to count red cells with urine culture pending. White count was 8.8, BUN 23, creatinine 1.48. He was started on pain medicine and tamsulosin. He is a retired Psychologist, sport and exercise.    Patient denies prior urologic history or kidney stone passage.    Today, he is doing well. Still has some left flank pain which might be radiating down toward the bladder now. Pain is improved. He is staying hydrated.     UA clear today.   Past Medical History Problems  1. History of diabetes mellitus (Z86.39) 2. History of gout (Z87.39)  Surgical History Problems  1. History of Appendectomy 2. History of Back Surgery 3. History of Tonsillectomy  Current Meds 1. Advil TABS;  Therapy: (Recorded:26Sep2016) to Recorded 2. Allopurinol 100 MG Oral Tablet;  Therapy: (Recorded:26Sep2016) to Recorded 3. Aspirin 81 MG TABS;  Therapy: (Recorded:26Sep2016) to Recorded 4. Flomax 0.4 MG Oral Capsule;  Therapy: (Recorded:26Sep2016) to Recorded 5. GlipiZIDE XL 2.5 MG Oral Tablet Extended Release 24 Hour;  Therapy: (Recorded:26Sep2016) to Recorded 6. OxyCODONE HCl - 5 MG Oral Capsule;  Therapy: (Recorded:26Sep2016) to Recorded 7. Zofran TABS;  Therapy: (Recorded:26Sep2016) to Recorded  Allergies Medication  1. No Known Drug Allergies  Family History Problems  1. Family history of Aneurysm : Father  Social History Problems    Chews tobacco (Z78.9)   Father deceased   Married   Mother deceased   No alcohol use   One child   Retired  Review of Systems Genitourinary, constitutional, skin, eye,  otolaryngeal, hematologic/lymphatic, cardiovascular, pulmonary, endocrine, musculoskeletal, gastrointestinal, neurological and psychiatric system(s) were reviewed and pertinent findings if present are noted and are otherwise negative.    Vitals Vital Signs [Data Includes: Last 1 Day]  Recorded: 26Sep2016 10:26AM  Height: 6 ft  Weight: 222 lb  BMI Calculated: 30.11 BSA Calculated: 2.23 Blood Pressure: 145 / 91 Temperature: 97.6 F Heart Rate: 100  Physical Exam Constitutional: Well nourished and well developed . No acute distress.  Pulmonary: No respiratory distress and normal respiratory rhythm and effort.  Cardiovascular: Heart rate and rhythm are normal . No peripheral edema.  Neuro/Psych:. Mood and affect are appropriate.    Results/Data Urine [Data Includes: Last 1 Day]   26Sep2016  COLOR YELLOW   APPEARANCE CLEAR   SPECIFIC GRAVITY 1.020   pH 5.5   GLUCOSE NEGATIVE   BILIRUBIN NEGATIVE   KETONE NEGATIVE   BLOOD 3+   PROTEIN NEGATIVE   NITRITE NEGATIVE   LEUKOCYTE ESTERASE NEGATIVE   SQUAMOUS EPITHELIAL/HPF 0-5 HPF  WBC 0-5 WBC/HPF  RBC 3-10 RBC/HPF  BACTERIA NONE SEEN HPF  CRYSTALS NONE SEEN HPF  CASTS See Below LPF  Yeast NONE SEEN HPF   Procedure KUB-comparison to prior CT, findings: I did not appreciate any stones over the course of the kidney, ureter or bladder. There were some stable pelvic phleboliths or atherosclerosis. The patient appeared normal. The bowel gas pattern appeared normal.     Assessment Assessed  1. Left ureteral stone (N20.1)  Plan Health Maintenance  1. UA With REFLEX; [Do Not Release]; Status:Complete;   Done: [id number] 10:15AM  Left ureteral stone  2. Follow-up Schedule Surgery Office  Follow-up  Status: Hold For - Appointment   Requested for: [id number] 6. KUB; Status:Resulted - Requires Verification;   Done: [id number] 10:56AM  Discussion/Summary left ureteral stone - a long discussion with the patient and his wife and  daughter-in-law disease works in the [place]) about the nature risk and benefits of continued stone passage with off label alpha-blocker use, attempt at ESWL or ureteroscopy with possible pre-stenting. All questions answered. The patient elected to proceed with ureteroscopy and we will set this up for the near future.    Hydronephrosis- He has some hydronephrosis on the CT and we discussed that as well. I felt reassured today given that he is staying hydrated, vitals are normal and that his UA appears clear today. A urine cx is pending.       Signatures Electronically signed by : Festus Aloe, M.D.; Oct 12 2014  1:21PM EST

## 2014-11-06 ENCOUNTER — Encounter (HOSPITAL_BASED_OUTPATIENT_CLINIC_OR_DEPARTMENT_OTHER): Payer: Self-pay | Admitting: Certified Registered"

## 2014-11-06 ENCOUNTER — Ambulatory Visit (HOSPITAL_BASED_OUTPATIENT_CLINIC_OR_DEPARTMENT_OTHER): Payer: Medicare Other | Admitting: Certified Registered"

## 2014-11-06 ENCOUNTER — Ambulatory Visit (HOSPITAL_BASED_OUTPATIENT_CLINIC_OR_DEPARTMENT_OTHER)
Admission: RE | Admit: 2014-11-06 | Discharge: 2014-11-06 | Disposition: A | Discharge status: Home / Self Care | Payer: Medicare Other | Source: Ambulatory Visit | Attending: Urology | Admitting: Urology

## 2014-11-06 ENCOUNTER — Encounter (HOSPITAL_BASED_OUTPATIENT_CLINIC_OR_DEPARTMENT_OTHER)
Admission: RE | Disposition: A | Discharge status: Home / Self Care | Payer: Self-pay | Source: Ambulatory Visit | Attending: Urology

## 2014-11-06 ENCOUNTER — Ambulatory Visit (HOSPITAL_COMMUNITY): Payer: Medicare Other

## 2014-11-06 DIAGNOSIS — Z7984 Long term (current) use of oral hypoglycemic drugs: Secondary | ICD-10-CM | POA: Diagnosis not present

## 2014-11-06 DIAGNOSIS — E119 Type 2 diabetes mellitus without complications: Secondary | ICD-10-CM | POA: Insufficient documentation

## 2014-11-06 DIAGNOSIS — F17220 Nicotine dependence, chewing tobacco, uncomplicated: Secondary | ICD-10-CM | POA: Diagnosis not present

## 2014-11-06 DIAGNOSIS — Z538 Procedure and treatment not carried out for other reasons: Secondary | ICD-10-CM | POA: Insufficient documentation

## 2014-11-06 DIAGNOSIS — Z791 Long term (current) use of non-steroidal anti-inflammatories (NSAID): Secondary | ICD-10-CM | POA: Insufficient documentation

## 2014-11-06 DIAGNOSIS — M109 Gout, unspecified: Secondary | ICD-10-CM | POA: Insufficient documentation

## 2014-11-06 DIAGNOSIS — N201 Calculus of ureter: Secondary | ICD-10-CM

## 2014-11-06 DIAGNOSIS — Z7982 Long term (current) use of aspirin: Secondary | ICD-10-CM | POA: Diagnosis not present

## 2014-11-06 DIAGNOSIS — Z79891 Long term (current) use of opiate analgesic: Secondary | ICD-10-CM | POA: Insufficient documentation

## 2014-11-06 DIAGNOSIS — Z79899 Other long term (current) drug therapy: Secondary | ICD-10-CM | POA: Diagnosis not present

## 2014-11-06 DIAGNOSIS — N132 Hydronephrosis with renal and ureteral calculous obstruction: Secondary | ICD-10-CM | POA: Insufficient documentation

## 2014-11-06 DIAGNOSIS — R109 Unspecified abdominal pain: Secondary | ICD-10-CM | POA: Diagnosis present

## 2014-11-06 HISTORY — DX: Personal history of colonic polyps: Z86.010

## 2014-11-06 HISTORY — DX: Personal history of colon polyps, unspecified: Z86.0100

## 2014-11-06 HISTORY — DX: Type 2 diabetes mellitus without complications: E11.9

## 2014-11-06 HISTORY — DX: Nocturia: R35.1

## 2014-11-06 HISTORY — DX: Calculus of ureter: N20.1

## 2014-11-06 LAB — POCT I-STAT 4, (NA,K, GLUC, HGB,HCT)
GLUCOSE: 117 mg/dL — AB (ref 65–99)
HEMATOCRIT: 45 % (ref 39.0–52.0)
HEMOGLOBIN: 15.3 g/dL (ref 13.0–17.0)
Potassium: 4.2 mmol/L (ref 3.5–5.1)
SODIUM: 139 mmol/L (ref 135–145)

## 2014-11-06 SURGERY — CANCELLED PROCEDURE
Anesthesia: General

## 2014-11-06 MED ORDER — CEFAZOLIN SODIUM-DEXTROSE 2-3 GM-% IV SOLR
2.0000 g | INTRAVENOUS | Status: DC
  Filled 2014-11-06: qty 50

## 2014-11-06 MED ORDER — LACTATED RINGERS IV SOLN
INTRAVENOUS | Status: DC
  Administered 2014-11-06: 10:00:00 via INTRAVENOUS
  Filled 2014-11-06: qty 1000

## 2014-11-06 MED ORDER — CEFAZOLIN SODIUM-DEXTROSE 2-3 GM-% IV SOLR
INTRAVENOUS | Status: AC
  Filled 2014-11-06: qty 50

## 2014-11-06 MED ORDER — CEFAZOLIN SODIUM 1-5 GM-% IV SOLN
1.0000 g | INTRAVENOUS | Status: DC
  Filled 2014-11-06: qty 50

## 2014-11-06 MED ORDER — FENTANYL CITRATE (PF) 100 MCG/2ML IJ SOLN
INTRAMUSCULAR | Status: AC
  Filled 2014-11-06: qty 4

## 2014-11-06 SURGICAL SUPPLY — 37 items
ADAPTER CATH URET PLST 4-6FR (CATHETERS) IMPLANT
BAG DRAIN URO-CYSTO SKYTR STRL (DRAIN) IMPLANT
BASKET LASER NITINOL 1.9FR (BASKET) IMPLANT
BASKET STNLS GEMINI 4WIRE 3FR (BASKET) IMPLANT
BASKET ZERO TIP NITINOL 2.4FR (BASKET) IMPLANT
CANISTER SUCT LVC 12 LTR MEDI- (MISCELLANEOUS) IMPLANT
CATH INTERMIT  6FR 70CM (CATHETERS) IMPLANT
CATH URET 5FR 28IN CONE TIP (BALLOONS)
CATH URET 5FR 28IN OPEN ENDED (CATHETERS) IMPLANT
CATH URET 5FR 70CM CONE TIP (BALLOONS) IMPLANT
CATH URET DUAL LUMEN 6-10FR 50 (CATHETERS) IMPLANT
CLOTH BEACON ORANGE TIMEOUT ST (SAFETY) IMPLANT
ELECT REM PT RETURN 9FT ADLT (ELECTROSURGICAL)
ELECTRODE REM PT RTRN 9FT ADLT (ELECTROSURGICAL) IMPLANT
FIBER LASER FLEXIVA 365 (UROLOGICAL SUPPLIES) IMPLANT
FIBER LASER TRAC TIP (UROLOGICAL SUPPLIES) IMPLANT
GLOVE BIO SURGEON STRL SZ7.5 (GLOVE) IMPLANT
GOWN STRL REUS W/ TWL LRG LVL3 (GOWN DISPOSABLE) IMPLANT
GOWN STRL REUS W/ TWL XL LVL3 (GOWN DISPOSABLE) IMPLANT
GOWN STRL REUS W/TWL LRG LVL3 (GOWN DISPOSABLE)
GOWN STRL REUS W/TWL XL LVL3 (GOWN DISPOSABLE)
GUIDEWIRE 0.038 PTFE COATED (WIRE) IMPLANT
GUIDEWIRE ANG ZIPWIRE 038X150 (WIRE) IMPLANT
GUIDEWIRE STR DUAL SENSOR (WIRE) IMPLANT
IV NS IRRIG 3000ML ARTHROMATIC (IV SOLUTION) IMPLANT
KIT BALLIN UROMAX 15FX10 (LABEL) IMPLANT
KIT BALLN UROMAX 15FX4 (MISCELLANEOUS) IMPLANT
KIT BALLN UROMAX 26 75X4 (MISCELLANEOUS)
KIT ROOM TURNOVER WOR (KITS) IMPLANT
MANIFOLD NEPTUNE II (INSTRUMENTS) IMPLANT
PACK CYSTO (CUSTOM PROCEDURE TRAY) IMPLANT
SET HIGH PRES BAL DIL (LABEL)
SHEATH ACCESS URETERAL 38CM (SHEATH) IMPLANT
SURGILUBE 2OZ TUBE FLIPTOP (MISCELLANEOUS) IMPLANT
SYRINGE IRR TOOMEY STRL 70CC (SYRINGE) IMPLANT
TUBE CONNECTING 12'X1/4 (SUCTIONS)
TUBE CONNECTING 12X1/4 (SUCTIONS) IMPLANT

## 2014-11-06 NOTE — Anesthesia Preprocedure Evaluation (Signed)
Anesthesia Evaluation  Patient identified by MRN, date of birth, ID band Patient awake    Reviewed: Allergy & Precautions, NPO status , Patient's Chart, lab work & pertinent test results  Airway Mallampati: II  TM Distance: >3 FB Neck ROM: Full    Dental no notable dental hx.    Pulmonary former smoker,    Pulmonary exam normal breath sounds clear to auscultation       Cardiovascular negative cardio ROS Normal cardiovascular exam Rhythm:Regular Rate:Normal     Neuro/Psych negative neurological ROS  negative psych ROS   GI/Hepatic negative GI ROS, Neg liver ROS,   Endo/Other  diabetes, Type 2, Oral Hypoglycemic Agents  Renal/GU negative Renal ROS  negative genitourinary   Musculoskeletal negative musculoskeletal ROS (+)   Abdominal   Peds negative pediatric ROS (+)  Hematology negative hematology ROS (+)   Anesthesia Other Findings   Reproductive/Obstetrics negative OB ROS                             Anesthesia Physical Anesthesia Plan  ASA: II  Anesthesia Plan: General   Post-op Pain Management:    Induction: Intravenous  Airway Management Planned: LMA  Additional Equipment:   Intra-op Plan:   Post-operative Plan: Extubation in OR  Informed Consent: I have reviewed the patients History and Physical, chart, labs and discussed the procedure including the risks, benefits and alternatives for the proposed anesthesia with the patient or authorized representative who has indicated his/her understanding and acceptance.   Dental advisory given  Plan Discussed with: CRNA  Anesthesia Plan Comments:         Anesthesia Quick Evaluation

## 2014-11-06 NOTE — Interval H&P Note (Signed)
History and Physical Interval Note:  11/06/2014 10:10 AM  Daniel Mercado has not had pain nor seen a stone pass. We discussed URS vs cont stone passage. We discussed it and they wanted to check a KUB to determine if we could see it. We discussed I couldn't see the stone on the first one.    Donathan Buller

## 2014-11-06 NOTE — Interval H&P Note (Signed)
History and Physical Interval Note:  11/06/2014 10:42 AM  Daniel Mercado I reviewed the KUB and did not appreciate a stone. I discussed it with the patient and his family and we discussed the only way to know for sure the stone passed via CT scan or cystoscopy with retrograde pyelogram and ureteroscopy as planned today. Again we discussed the risk and benefits of continued surveillance versus surgery. The patient does not want surgery or intervention unless he needs it. I think this is reasonable. I'll set him up in the office with a left renal ultrasound in a week or 2.   Daniel Mercado

## 2014-12-18 DIAGNOSIS — N201 Calculus of ureter: Secondary | ICD-10-CM | POA: Diagnosis not present

## 2014-12-24 ENCOUNTER — Encounter: Payer: Self-pay | Admitting: Family Medicine

## 2014-12-24 ENCOUNTER — Ambulatory Visit (INDEPENDENT_AMBULATORY_CARE_PROVIDER_SITE_OTHER): Payer: Medicare Other | Admitting: Family Medicine

## 2014-12-24 ENCOUNTER — Other Ambulatory Visit: Payer: Self-pay | Admitting: *Deleted

## 2014-12-24 ENCOUNTER — Telehealth: Payer: Self-pay | Admitting: Family Medicine

## 2014-12-24 VITALS — BP 126/74 | Ht 72.0 in | Wt 213.2 lb

## 2014-12-24 DIAGNOSIS — Z13220 Encounter for screening for lipoid disorders: Secondary | ICD-10-CM

## 2014-12-24 DIAGNOSIS — E119 Type 2 diabetes mellitus without complications: Secondary | ICD-10-CM | POA: Diagnosis not present

## 2014-12-24 DIAGNOSIS — I10 Essential (primary) hypertension: Secondary | ICD-10-CM

## 2014-12-24 DIAGNOSIS — M1000 Idiopathic gout, unspecified site: Secondary | ICD-10-CM | POA: Diagnosis not present

## 2014-12-24 DIAGNOSIS — Z79899 Other long term (current) drug therapy: Secondary | ICD-10-CM

## 2014-12-24 DIAGNOSIS — E0842 Diabetes mellitus due to underlying condition with diabetic polyneuropathy: Secondary | ICD-10-CM

## 2014-12-24 DIAGNOSIS — Z23 Encounter for immunization: Secondary | ICD-10-CM

## 2014-12-24 LAB — POCT GLYCOSYLATED HEMOGLOBIN (HGB A1C): Hemoglobin A1C: 6.1

## 2014-12-24 MED ORDER — ALLOPURINOL 100 MG PO TABS: 200.0000 mg | ORAL_TABLET | Freq: Every day | ORAL | Status: DC

## 2014-12-24 MED ORDER — TAMSULOSIN HCL 0.4 MG PO CAPS: 0.4000 mg | ORAL_CAPSULE | Freq: Every day | ORAL | Status: DC

## 2014-12-24 MED ORDER — GLIPIZIDE ER 2.5 MG PO TB24: 2.5000 mg | ORAL_TABLET | Freq: Every day | ORAL | Status: DC

## 2014-12-24 MED ORDER — TAMSULOSIN HCL 0.4 MG PO CAPS: ORAL_CAPSULE | ORAL | Status: DC

## 2014-12-24 NOTE — Telephone Encounter (Signed)
Done . Pt notified.  

## 2014-12-24 NOTE — Progress Notes (Signed)
   Subjective:    Patient ID: Daniel Mercado, male    DOB: 01-14-1932, 81 y.o.   MRN: [id number]  Diabetes He presents for his follow-up diabetic visit. He has type 2 diabetes mellitus. No MedicAlert identification noted. He has not had a previous visit with a dietitian. He does not see a podiatrist.Eye exam is current (Febuary 2016).  sugars generally running good history of diabetic neuropathy, still notes some diminished sensation feet  Hemoglobin A1c today is: 6.1 Results for orders placed or performed in visit on 12/24/14  POCT HgB A1C  Result Value Ref Range   Hemoglobin A1C 6.1    No gout attsckls , comp with meds. Medications reviewed today. No further gout attacks. Watching diet in this regard.  Urine better on flomax, ocas gets up.Stacey Flomax definitely helps. No obvious side effects. Help him sleep better at night. Patient states no concerns this visit.  Review of Systems No headache no chest pain no back pain no abdominal pain no change in bowel habits ROS otherwise negative    Objective:   Physical Exam  Alert vital stable blood pressure good on repeat HEENT normal lungs clear heart regular in rhythm ankles without edema      Assessment & Plan:  Impression 1 type 2 diabetes good control meds reviewed continue same #2 prostate hypertrophy good control on meds maintain same meds reviewed #3 gout clinically stable on meds no further attacks meds reviewed maintain same plan diet exercise discussed. Appropriate blood work. Flu shot today. Check every 6 months. Exercise encourage WSL

## 2014-12-24 NOTE — Telephone Encounter (Signed)
Ok modify in med list and do one yr worth of refill

## 2014-12-24 NOTE — Telephone Encounter (Signed)
Patient was seen today and forgot to notify nurse that his flomax 0.4 capsules was increased to 2 a day and he needs new prescription called into [facility]. He states 2 a day works before than 1 and needs enough to last 30 days is currently out .

## 2014-12-25 DIAGNOSIS — I10 Essential (primary) hypertension: Secondary | ICD-10-CM | POA: Diagnosis not present

## 2014-12-25 DIAGNOSIS — Z13220 Encounter for screening for lipoid disorders: Secondary | ICD-10-CM | POA: Diagnosis not present

## 2014-12-25 DIAGNOSIS — Z79899 Other long term (current) drug therapy: Secondary | ICD-10-CM | POA: Diagnosis not present

## 2014-12-25 DIAGNOSIS — E119 Type 2 diabetes mellitus without complications: Secondary | ICD-10-CM | POA: Diagnosis not present

## 2014-12-26 DIAGNOSIS — E1140 Type 2 diabetes mellitus with diabetic neuropathy, unspecified: Secondary | ICD-10-CM | POA: Insufficient documentation

## 2014-12-26 LAB — HEPATIC FUNCTION PANEL
ALK PHOS: 60 IU/L (ref 39–117)
ALT: 12 IU/L (ref 0–44)
AST: 24 IU/L (ref 0–40)
Albumin: 4.1 g/dL (ref 3.5–4.7)
BILIRUBIN TOTAL: 0.6 mg/dL (ref 0.0–1.2)
BILIRUBIN, DIRECT: 0.14 mg/dL (ref 0.00–0.40)
Total Protein: 7.1 g/dL (ref 6.0–8.5)

## 2014-12-26 LAB — LIPID PANEL
Chol/HDL Ratio: 3.6 ratio units (ref 0.0–5.0)
Cholesterol, Total: 159 mg/dL (ref 100–199)
HDL: 44 mg/dL (ref >39)
LDL Calculated: 99 mg/dL (ref 0–99)
TRIGLYCERIDES: 81 mg/dL (ref 0–149)
VLDL Cholesterol Cal: 16 mg/dL (ref 5–40)

## 2014-12-26 LAB — MICROALBUMIN / CREATININE URINE RATIO
CREATININE, UR: 88.1 mg/dL
MICROALB/CREAT RATIO: <3.4 mg/g creat (ref 0.0–30.0)
Microalbumin, Urine: <3 ug/mL

## 2014-12-26 LAB — BASIC METABOLIC PANEL
BUN / CREAT RATIO: 16 (ref 10–22)
BUN: 20 mg/dL (ref 8–27)
CHLORIDE: 100 mmol/L (ref 97–106)
CO2: 23 mmol/L (ref 18–29)
CREATININE: 1.22 mg/dL (ref 0.76–1.27)
Calcium: 9.7 mg/dL (ref 8.6–10.2)
GFR calc non Af Amer: 55 mL/min/{1.73_m2} — ABNORMAL LOW (ref >59)
GFR, EST AFRICAN AMERICAN: 64 mL/min/{1.73_m2} (ref >59)
GLUCOSE: 106 mg/dL — AB (ref 65–99)
Potassium: 4.7 mmol/L (ref 3.5–5.2)
SODIUM: 140 mmol/L (ref 136–144)

## 2014-12-30 ENCOUNTER — Encounter: Payer: Self-pay | Admitting: Family Medicine

## 2015-01-21 ENCOUNTER — Telehealth: Payer: Self-pay | Admitting: Family Medicine

## 2015-01-21 NOTE — Telephone Encounter (Signed)
tamsulosin (FLOMAX) 0.4 MG CAPS capsule  [facility] is saying they only have on file with them to take this  Med dialy, not BID.  He needs BID with 11 refills as we sent in 12/8  They will not fill it until this is corrected

## 2015-01-21 NOTE — Telephone Encounter (Signed)
Called pharmacy and verified that the prescription was sent over for twice a day which is a total of 60 capsules with 11 refills. Pharmacy verified that they had the correct prescription with refills. Patient notified.

## 2015-02-03 DIAGNOSIS — C44319 Basal cell carcinoma of skin of other parts of face: Secondary | ICD-10-CM | POA: Diagnosis not present

## 2015-02-03 DIAGNOSIS — L570 Actinic keratosis: Secondary | ICD-10-CM | POA: Diagnosis not present

## 2015-02-03 DIAGNOSIS — C44310 Basal cell carcinoma of skin of unspecified parts of face: Secondary | ICD-10-CM | POA: Diagnosis not present

## 2015-03-29 ENCOUNTER — Encounter: Payer: Self-pay | Admitting: *Deleted

## 2015-04-06 DIAGNOSIS — Z961 Presence of intraocular lens: Secondary | ICD-10-CM | POA: Diagnosis not present

## 2015-04-06 DIAGNOSIS — H2511 Age-related nuclear cataract, right eye: Secondary | ICD-10-CM | POA: Diagnosis not present

## 2015-04-06 DIAGNOSIS — E119 Type 2 diabetes mellitus without complications: Secondary | ICD-10-CM | POA: Diagnosis not present

## 2015-06-22 ENCOUNTER — Other Ambulatory Visit: Payer: Self-pay | Admitting: Family Medicine

## 2015-06-24 ENCOUNTER — Ambulatory Visit: Payer: Medicare Other | Admitting: Family Medicine

## 2015-06-25 ENCOUNTER — Ambulatory Visit (INDEPENDENT_AMBULATORY_CARE_PROVIDER_SITE_OTHER): Payer: Medicare Other | Admitting: Family Medicine

## 2015-06-25 ENCOUNTER — Encounter: Payer: Self-pay | Admitting: Family Medicine

## 2015-06-25 VITALS — BP 132/88 | Ht 72.0 in | Wt 216.0 lb

## 2015-06-25 DIAGNOSIS — E119 Type 2 diabetes mellitus without complications: Secondary | ICD-10-CM

## 2015-06-25 DIAGNOSIS — E0842 Diabetes mellitus due to underlying condition with diabetic polyneuropathy: Secondary | ICD-10-CM

## 2015-06-25 DIAGNOSIS — I10 Essential (primary) hypertension: Secondary | ICD-10-CM | POA: Diagnosis not present

## 2015-06-25 DIAGNOSIS — N400 Enlarged prostate without lower urinary tract symptoms: Secondary | ICD-10-CM | POA: Diagnosis not present

## 2015-06-25 DIAGNOSIS — M1000 Idiopathic gout, unspecified site: Secondary | ICD-10-CM | POA: Diagnosis not present

## 2015-06-25 LAB — POCT GLYCOSYLATED HEMOGLOBIN (HGB A1C): Hemoglobin A1C: 5.9

## 2015-06-25 NOTE — Progress Notes (Signed)
   Subjective:    Patient ID: Daniel Mercado, male    DOB: [birth year], 82 y.o.   MRN: [id number]  Diabetes He presents for his follow-up diabetic visit. He has type 2 diabetes mellitus. He is compliant with treatment all of the time. His overall blood glucose range is 110-130 mg/dl. He does not see a podiatrist.Eye exam is current.   Results for orders placed or performed in visit on 06/25/15  POCT glycosylated hemoglobin (Hb A1C)  Result Value Ref Range   Hemoglobin A1C 5.9    Glu 120 the highest, eats most anything but does watch the sugary foods  Stays active walks a lot, working still on the fam  urinatin decent on the flomax, overall a lot better. No side effects with the Flomax. Takes faithfully. Does not miss a dose.  Eye doc visit due soon  No gout on thre med, no problems with, had a couple mini flares but likely not. Compliant with allopurinol. Now down to just 100 mg daily   continues  To have slight numbness distal feet, some mild tingling Pt states no other concerns today.    Review of Systems No headache, no major weight loss or weight gain, no chest pain no back pain abdominal pain no change in bowel habits complete ROS otherwise negative     Objective:   Physical Exam  Alert vital stable HEENT normal. Blood pressure good on repeat lungs clear heart rare rhythm ankles without edema C diabetic foot exam    Assessment & Plan:  Impression 1 type 2 diabetes good control A1c excellent to maintain same dose #2 gout good control off and on reduced dose of allopurinol we'll clarify and medications #3 prostate hypertrophy clinically stable maintain same dose #4 diabetic neuropathy stable but persistent discussed along with foot exams plan as above plus all meds refilled. Diet exercise discussed. Recheck in 6 months. WSL

## 2015-07-12 ENCOUNTER — Telehealth: Payer: Self-pay | Admitting: Family Medicine

## 2015-07-12 NOTE — Telephone Encounter (Signed)
TCNA (called in testing strips to [facility])

## 2015-07-12 NOTE — Telephone Encounter (Signed)
Pt is needing test strips for an easy max glucometer called in. Pt tests his sugar once a week.     [facility]

## 2015-07-21 ENCOUNTER — Other Ambulatory Visit: Payer: Self-pay | Admitting: Family Medicine

## 2015-10-19 DIAGNOSIS — Z961 Presence of intraocular lens: Secondary | ICD-10-CM | POA: Diagnosis not present

## 2015-10-19 DIAGNOSIS — H2511 Age-related nuclear cataract, right eye: Secondary | ICD-10-CM | POA: Diagnosis not present

## 2015-10-27 DIAGNOSIS — H2511 Age-related nuclear cataract, right eye: Secondary | ICD-10-CM | POA: Diagnosis not present

## 2015-11-03 DIAGNOSIS — H5703 Miosis: Secondary | ICD-10-CM | POA: Diagnosis not present

## 2015-11-03 DIAGNOSIS — H21561 Pupillary abnormality, right eye: Secondary | ICD-10-CM | POA: Diagnosis not present

## 2015-11-03 DIAGNOSIS — H2511 Age-related nuclear cataract, right eye: Secondary | ICD-10-CM | POA: Diagnosis not present

## 2015-11-16 DIAGNOSIS — Z23 Encounter for immunization: Secondary | ICD-10-CM | POA: Diagnosis not present

## 2015-12-24 ENCOUNTER — Ambulatory Visit: Payer: Medicare Other | Admitting: Family Medicine

## 2015-12-28 ENCOUNTER — Ambulatory Visit (INDEPENDENT_AMBULATORY_CARE_PROVIDER_SITE_OTHER): Payer: Medicare Other | Admitting: Family Medicine

## 2015-12-28 ENCOUNTER — Encounter: Payer: Self-pay | Admitting: Family Medicine

## 2015-12-28 VITALS — BP 130/84 | Ht 72.0 in | Wt 215.8 lb

## 2015-12-28 DIAGNOSIS — Z79899 Other long term (current) drug therapy: Secondary | ICD-10-CM

## 2015-12-28 DIAGNOSIS — N400 Enlarged prostate without lower urinary tract symptoms: Secondary | ICD-10-CM | POA: Diagnosis not present

## 2015-12-28 DIAGNOSIS — E119 Type 2 diabetes mellitus without complications: Secondary | ICD-10-CM

## 2015-12-28 DIAGNOSIS — E785 Hyperlipidemia, unspecified: Secondary | ICD-10-CM | POA: Diagnosis not present

## 2015-12-28 LAB — HM DIABETES EYE EXAM

## 2015-12-28 LAB — POCT GLYCOSYLATED HEMOGLOBIN (HGB A1C): HEMOGLOBIN A1C: 5.7

## 2015-12-28 MED ORDER — GLIPIZIDE ER 2.5 MG PO TB24: ORAL_TABLET | ORAL | 5 refills | Status: DC

## 2015-12-28 NOTE — Progress Notes (Signed)
   Subjective:    Patient ID: Daniel Mercado, male    DOB: 1931-12-12, 82 y.o.   MRN: [id number]  Diabetes  He presents for his follow-up diabetic visit. He has type 2 diabetes mellitus. Risk factors for coronary artery disease include diabetes mellitus. Current diabetic treatment includes oral agent (monotherapy). He is compliant with treatment all of the time. His weight is stable. He is following a diabetic diet.    Results for orders placed or performed in visit on 06/25/15  POCT glycosylated hemoglobin (Hb A1C)  Result Value Ref Range   Hemoglobin A1C 5.9    Needs two flomaax to help urinate, without it up tooo many times per night  Patient claims compliance with diabetes medication. No obvious side effects. Reports no substantial low sugar spells. Most numbers are generally in good range when checked fasting. Generally does not miss a dose of medication. Watching diabetic diet closely  Gout.  Hx numbnewss of feet, no longer evident  Already had flu shot    Review of Systems No headache, no major weight loss or weight gain, no chest pain no back pain abdominal pain no change in bowel habits complete ROS otherwise negative     Objective:   Physical Exam  Alert vitals stable, NAD. Blood pressure good on repeat. HEENT normal. Lungs clear. Heart regular rate and rhythm.       Assessment & Plan:  Impression 1 type 2 diabetes good control discussed maintain same meds #2 gout good control discussed maintain same meds Amer 3 prostate hypertrophy. Ongoing need for medications discussed maintain same #4 sensory neuropathy now resolved discussed plan appropriate blood work. Diet exercise discussed. Flu shot discussed WSL

## 2015-12-29 LAB — HEPATIC FUNCTION PANEL
ALBUMIN: 4.5 g/dL (ref 3.5–4.7)
ALK PHOS: 55 IU/L (ref 39–117)
ALT: 15 IU/L (ref 0–44)
AST: 18 IU/L (ref 0–40)
BILIRUBIN, DIRECT: 0.22 mg/dL (ref 0.00–0.40)
Bilirubin Total: 0.8 mg/dL (ref 0.0–1.2)
TOTAL PROTEIN: 7.1 g/dL (ref 6.0–8.5)

## 2015-12-29 LAB — BASIC METABOLIC PANEL
BUN/Creatinine Ratio: 13 (ref 10–24)
BUN: 17 mg/dL (ref 8–27)
CO2: 25 mmol/L (ref 18–29)
CREATININE: 1.31 mg/dL — AB (ref 0.76–1.27)
Calcium: 9.6 mg/dL (ref 8.6–10.2)
Chloride: 100 mmol/L (ref 96–106)
GFR calc Af Amer: 58 mL/min/{1.73_m2} — ABNORMAL LOW (ref >59)
GFR, EST NON AFRICAN AMERICAN: 50 mL/min/{1.73_m2} — AB (ref >59)
Glucose: 109 mg/dL — ABNORMAL HIGH (ref 65–99)
POTASSIUM: 4.7 mmol/L (ref 3.5–5.2)
SODIUM: 140 mmol/L (ref 134–144)

## 2015-12-29 LAB — MICROALBUMIN / CREATININE URINE RATIO
Creatinine, Urine: 95.4 mg/dL
Microalb/Creat Ratio: <3.1 mg/g creat (ref 0.0–30.0)
Microalbumin, Urine: <3 ug/mL

## 2015-12-29 LAB — LIPID PANEL
CHOLESTEROL TOTAL: 166 mg/dL (ref 100–199)
Chol/HDL Ratio: 3.5 ratio units (ref 0.0–5.0)
HDL: 47 mg/dL (ref >39)
LDL Calculated: 103 mg/dL — ABNORMAL HIGH (ref 0–99)
Triglycerides: 78 mg/dL (ref 0–149)
VLDL CHOLESTEROL CAL: 16 mg/dL (ref 5–40)

## 2016-01-04 ENCOUNTER — Encounter: Payer: Self-pay | Admitting: Family Medicine

## 2016-01-19 ENCOUNTER — Other Ambulatory Visit: Payer: Self-pay | Admitting: Family Medicine

## 2016-02-16 DIAGNOSIS — L821 Other seborrheic keratosis: Secondary | ICD-10-CM | POA: Diagnosis not present

## 2016-02-16 DIAGNOSIS — D229 Melanocytic nevi, unspecified: Secondary | ICD-10-CM | POA: Diagnosis not present

## 2016-02-16 DIAGNOSIS — L570 Actinic keratosis: Secondary | ICD-10-CM | POA: Diagnosis not present

## 2016-02-23 ENCOUNTER — Encounter: Payer: Self-pay | Admitting: Family Medicine

## 2016-02-23 ENCOUNTER — Ambulatory Visit (INDEPENDENT_AMBULATORY_CARE_PROVIDER_SITE_OTHER): Payer: Medicare Other | Admitting: Family Medicine

## 2016-02-23 VITALS — BP 112/80 | Temp 101.0°F | Ht 72.0 in | Wt 217.5 lb

## 2016-02-23 DIAGNOSIS — J101 Influenza due to other identified influenza virus with other respiratory manifestations: Secondary | ICD-10-CM

## 2016-02-23 DIAGNOSIS — J329 Chronic sinusitis, unspecified: Secondary | ICD-10-CM

## 2016-02-23 MED ORDER — AMOXICILLIN-POT CLAVULANATE 875-125 MG PO TABS: 1.0000 | ORAL_TABLET | Freq: Two times a day (BID) | ORAL | 0 refills | Status: AC

## 2016-02-23 NOTE — Progress Notes (Signed)
   Subjective:    Patient ID: Daniel Mercado, male    DOB: 07-30-1931, 83 y.o.   MRN: [id number]  Cough  This is a new problem. The current episode started in the past 7 days. Associated symptoms include a fever, headaches, nasal congestion and a sore throat. Treatments tried: otc meds.   Started Sunday sneezing and cong  headach minimal, some ody ache  Energy poor   Appetite poor   Cough bad at times  Non porod cutive cough    Review of Systems  Constitutional: Positive for fever.  HENT: Positive for sore throat.   Respiratory: Positive for cough.   Neurological: Positive for headaches.       Objective:   Physical Exam  Alert vitals reviewed, moderate malaise. Hydration good. Positive nasal congestion lungs no crackles or wheezes, no tachypnea, intermittent bronchial cough during exam heart regular rate and rhythm.       Assessment & Plan:  Impression influenza discussed at length. Petra Kuba of illness and potential sequela discussed. Plan Tamiflu prescribed if indicated and timing appropriate.In this case not appropriate day for illness. Antibiotic added for probable sinus and bronchial: Symptom care discussed. Warning signs discussed. WSL

## 2016-02-23 NOTE — Patient Instructions (Signed)
This is the flu  We are covering with augmentin to prevent it moving tinto bronchitis or sinusitis or worse  Consider using advil for the fever or pain  Encourage liquids

## 2016-05-02 DIAGNOSIS — Z961 Presence of intraocular lens: Secondary | ICD-10-CM | POA: Diagnosis not present

## 2016-06-27 ENCOUNTER — Ambulatory Visit (INDEPENDENT_AMBULATORY_CARE_PROVIDER_SITE_OTHER): Payer: Medicare Other | Admitting: Family Medicine

## 2016-06-27 ENCOUNTER — Other Ambulatory Visit: Payer: Self-pay | Admitting: *Deleted

## 2016-06-27 ENCOUNTER — Encounter: Payer: Self-pay | Admitting: Family Medicine

## 2016-06-27 VITALS — BP 128/74 | Ht 72.0 in | Wt 215.0 lb

## 2016-06-27 DIAGNOSIS — N400 Enlarged prostate without lower urinary tract symptoms: Secondary | ICD-10-CM | POA: Diagnosis not present

## 2016-06-27 DIAGNOSIS — M1000 Idiopathic gout, unspecified site: Secondary | ICD-10-CM

## 2016-06-27 DIAGNOSIS — I10 Essential (primary) hypertension: Secondary | ICD-10-CM

## 2016-06-27 DIAGNOSIS — E119 Type 2 diabetes mellitus without complications: Secondary | ICD-10-CM | POA: Diagnosis not present

## 2016-06-27 LAB — POCT GLYCOSYLATED HEMOGLOBIN (HGB A1C): Hemoglobin A1C: 5.8

## 2016-06-27 MED ORDER — TAMSULOSIN HCL 0.4 MG PO CAPS: ORAL_CAPSULE | ORAL | 1 refills | Status: DC

## 2016-06-27 MED ORDER — ALLOPURINOL 100 MG PO TABS: 200.0000 mg | ORAL_TABLET | Freq: Every day | ORAL | 1 refills | Status: DC

## 2016-06-27 MED ORDER — GLIPIZIDE ER 2.5 MG PO TB24: ORAL_TABLET | ORAL | 1 refills | Status: DC

## 2016-06-27 NOTE — Progress Notes (Addendum)
   Subjective:    Patient ID: Daniel Mercado, male    DOB: 1931-08-06, 83 y.o.   MRN: [id number]  Diabetes  He presents for his follow-up diabetic visit. He has type 2 diabetes mellitus. Current diabetic treatments: glipizide. Diabetic meal planning: eats what he wants. He never participates in exercise. He does not see a podiatrist.Eye exam is current (dec 2017).   Results for orders placed or performed in visit on 06/27/16  POCT glycosylated hemoglobin (Hb A1C)  Result Value Ref Range   Hemoglobin A1C 5.8   HM DIABETES EYE EXAM  Result Value Ref Range   HM Diabetic Eye Exam No Retinopathy No Retinopathy    Results for orders placed or performed in visit on 06/27/16  POCT glycosylated hemoglobin (Hb A1C)  Result Value Ref Range   Hemoglobin A1C 5.8   HM DIABETES EYE EXAM  Result Value Ref Range   HM Diabetic Eye Exam No Retinopathy No Retinopathy   Sugar good, no problem   No attacks of gout lately   Eye doc, around nov or so   Gout stable, No further attacks. Compliant with allopurinol. No obvious side effects.  Urinating ovrall decent. Flomax helps urinating. No obvious side effects. Completely compliant.  Patient claims compliance with diabetes medication. No obvious side effects. Reports no substantial low sugar spells. Most numbers are generally in good range when checked fasting. Generally does not miss a dose of medication. Watching diabetic diet closely  Numbness in feet is the same has not changed  Review of Systems No headache, no major weight loss or weight gain, no chest pain no back pain abdominal pain no change in bowel habits complete ROS otherwise negative     Objective:   Physical Exam Alert and oriented, vitals reviewed and stable, NAD ENT-TM's and ext canals WNL bilat via otoscopic exam Soft palate, tonsils and post pharynx WNL via oropharyngeal exam Neck-symmetric, no masses; thyroid nonpalpable and nontender Pulmonary-no tachypnea or accessory  muscle use; Clear without wheezes via auscultation Card--no abnrml murmurs, rhythm reg and rate WNL Carotid pulses symmetric, without bruits  C diabetic foot exam       Assessment & Plan:  Impression 1 type 2 diabetes good control discussed A1c excellent #2 sensory neuropathy secondary diabetes discussed continue same approach #3 prostate hypertrophy medications helping maintain #4 gout no further recurrences patient maintain diet exercise discussed

## 2016-09-21 ENCOUNTER — Ambulatory Visit (INDEPENDENT_AMBULATORY_CARE_PROVIDER_SITE_OTHER): Payer: Medicare Other | Admitting: General Surgery

## 2016-09-21 ENCOUNTER — Encounter: Payer: Self-pay | Admitting: General Surgery

## 2016-09-21 VITALS — BP 158/85 | HR 74 | Temp 98.2°F | Resp 18 | Ht 72.0 in | Wt 214.0 lb

## 2016-09-21 DIAGNOSIS — K4090 Unilateral inguinal hernia, without obstruction or gangrene, not specified as recurrent: Secondary | ICD-10-CM | POA: Diagnosis not present

## 2016-09-21 NOTE — Progress Notes (Signed)
Daniel Mercado; [id number]; [birth year]   HPI Patient is an 83 year old white male who was referred to my care for a right inguinal hernia.  He states he had one episode of pain recently when the hernia was stuck out.  It resolved spontaneously after lying down for a while.  He currently has no pain.  He has had a known right inguinal hernia for many years.  It is never caused him a problem.  He currently has no pain.  No nausea or vomiting noted.  He was referred by Dr. Richardson Landry looking for further evaluation and treatment. Past Medical History:  Diagnosis Date  . Erectile dysfunction   . Gout   . History of colon polyps   . Left ureteral stone   . Nocturia   . Peripheral neuropathy   . Sciatica   . Type 2 diabetes mellitus ([place])     Past Surgical History:  Procedure Laterality Date  . COLONOSCOPY  last one 2009  . LUMBAR [place]    Family History  Problem Relation Age of Onset  . Diabetes Other     Current Outpatient Prescriptions on File Prior to Visit  Medication Sig Dispense Refill  . allopurinol (ZYLOPRIM) 100 MG tablet Take 2 tablets (200 mg total) by mouth daily. 180 tablet 1  . aspirin 81 MG tablet Take 81 mg by mouth daily.    Marland Kitchen glipiZIDE (GLUCOTROL XL) 2.5 MG 24 hr tablet TAKE ONE TABLET BY MOUTH ONCE DAILY WITH  BREAKFAST 90 tablet 1  . polyethylene glycol powder (MIRALAX) powder Take 17 g by mouth daily as needed for mild constipation or moderate constipation.     . tamsulosin (FLOMAX) 0.4 MG CAPS capsule TAKE TWO CAPSULES BY MOUTH ONCE DAILY 180 capsule 1   No current facility-administered medications on file prior to visit.     No Known Allergies  History  Alcohol Use No    History  Smoking Status  . Former Smoker  . Quit date: 10/30/1966  Smokeless Tobacco  . Current User  . Types: Chew    Review of Systems  Constitutional: Negative.   HENT: Negative.   Eyes: Negative.   Respiratory: Negative.   Cardiovascular: Negative.    Gastrointestinal: Negative.   Genitourinary: Negative.   Musculoskeletal: Negative.   Skin: Negative.   Neurological: Negative.   Endo/Heme/Allergies: Negative.   Psychiatric/Behavioral: Negative.     Objective   Vitals:   09/21/16 1221  BP: (!) 158/85  Pulse: 74  Resp: 18  Temp: 98.2 F (36.8 C)    Physical Exam  Constitutional: He is oriented to person, place, and time and well-developed, well-nourished, and in no distress.  HENT:  Head: Normocephalic and atraumatic.  Cardiovascular: Normal rate, regular rhythm and normal heart sounds.  Exam reveals no friction rub.   No murmur heard. Pulmonary/Chest: Effort normal and breath sounds normal. No respiratory distress. He has no wheezes. He has no rales.  Abdominal: Soft. Bowel sounds are normal. He exhibits no distension. There is no tenderness. There is no rebound.  Easily reducible right inguinal hernia.  Genitourinary:  Genitourinary Comments: GU exam wnl  Neurological: He is alert and oriented to person, place, and time.  Skin: Skin is warm and dry.  Vitals reviewed.   Assessment  Right inguinal hernia, currently asymptomatic Plan   Patient does not want surgery unless absolutely necessary.  No need for surgery at this time.  Incarceration risk low, and symptoms explained  to the patient.  Should he become more symptomatic, will reasses at that time.  Patient understands and agrees.  Follow up prn.

## 2016-09-21 NOTE — Patient Instructions (Signed)

## 2016-12-27 ENCOUNTER — Ambulatory Visit: Payer: Medicare Other | Admitting: Family Medicine

## 2016-12-28 ENCOUNTER — Ambulatory Visit (INDEPENDENT_AMBULATORY_CARE_PROVIDER_SITE_OTHER): Payer: Medicare Other | Admitting: Family Medicine

## 2016-12-28 ENCOUNTER — Encounter: Payer: Self-pay | Admitting: Family Medicine

## 2016-12-28 VITALS — BP 118/80 | Ht 72.0 in | Wt 215.0 lb

## 2016-12-28 DIAGNOSIS — I10 Essential (primary) hypertension: Secondary | ICD-10-CM | POA: Diagnosis not present

## 2016-12-28 DIAGNOSIS — E119 Type 2 diabetes mellitus without complications: Secondary | ICD-10-CM | POA: Diagnosis not present

## 2016-12-28 DIAGNOSIS — Z79899 Other long term (current) drug therapy: Secondary | ICD-10-CM

## 2016-12-28 DIAGNOSIS — Z13220 Encounter for screening for lipoid disorders: Secondary | ICD-10-CM

## 2016-12-28 DIAGNOSIS — N400 Enlarged prostate without lower urinary tract symptoms: Secondary | ICD-10-CM | POA: Diagnosis not present

## 2016-12-28 DIAGNOSIS — Z23 Encounter for immunization: Secondary | ICD-10-CM

## 2016-12-28 LAB — POCT GLYCOSYLATED HEMOGLOBIN (HGB A1C): HEMOGLOBIN A1C: 5.7

## 2016-12-28 MED ORDER — TAMSULOSIN HCL 0.4 MG PO CAPS: ORAL_CAPSULE | ORAL | 1 refills | Status: DC

## 2016-12-28 MED ORDER — GLIPIZIDE ER 2.5 MG PO TB24: ORAL_TABLET | ORAL | 1 refills | Status: DC

## 2016-12-28 NOTE — Progress Notes (Signed)
   Subjective:    Patient ID: Daniel Mercado, male    DOB: [birth year], 83 y.o.   MRN: [id number]  HPI Pt is here today for follow up on DM. He is currently taking Glipizide 2.5 one daily. He eats healthy and exercises. He sees an Programmer, systems.He does not see a podiatrist.  Results for orders placed or performed in visit on 12/28/16  POCT glycosylated hemoglobin (Hb A1C)  Result Value Ref Range   Hemoglobin A1C 5.7    No low sugar spells   Watching t Diet o and trying to eat the right thing  Patient claims compliance with diabetes medication. No obvious side effects. Reports no substantial low sugar spells. Most numbers are generally in good range when checked fasting. Generally does not miss a dose of medication. Watching diabetic diet closely    Patient has history of elevated blood pressure.  Follows without medication.  Watching diet.  Patient has history of gout.  No recent flares of gout.  Compliant with his medicine.  No obvious side effects.  Trying to watch his diet.  Ongoing challenges with prostate hypertrophy.  Medication definitely helps her.  Would like to maintain.  Continued numbness distal feet.  Discussed.  Review of Systems No headache, no major weight loss or weight gain, no chest pain no back pain abdominal pain no change in bowel habits complete ROS otherwise negative     Objective:   Physical Exam  Alert and oriented, vitals reviewed and stable, NAD ENT-TM's and ext canals WNL bilat via otoscopic exam Soft palate, tonsils and post pharynx WNL via oropharyngeal exam Neck-symmetric, no masses; thyroid nonpalpable and nontender Pulmonary-no tachypnea or accessory muscle use; Clear without wheezes via auscultation Card--no abnrml murmurs, rhythm reg and rate WNL Carotid pulses symmetric, without bruits       Assessment & Plan:  Impression type 2 diabetes.  Control discussed to maintain same meds  2.  Gout clinically stable discussed to maintain same  meds  3.  Sensory neuropathy ongoing but stable to continue same approach no pain associated  4.  Hypertension good control with self efforts  5.  Only outdoor patient expressed concerns about additional problems rectal dysfunction and memory loss.  Recommend return for wellness plus chronic visit in 6 weeks rationale discussed  Flu shot today appropriate for medication refill

## 2016-12-29 LAB — LIPID PANEL
CHOLESTEROL TOTAL: 151 mg/dL (ref 100–199)
Chol/HDL Ratio: 3.1 ratio (ref 0.0–5.0)
HDL: 49 mg/dL (ref >39)
LDL CALC: 90 mg/dL (ref 0–99)
TRIGLYCERIDES: 58 mg/dL (ref 0–149)
VLDL CHOLESTEROL CAL: 12 mg/dL (ref 5–40)

## 2016-12-29 LAB — HEPATIC FUNCTION PANEL
ALK PHOS: 60 IU/L (ref 39–117)
ALT: 12 IU/L (ref 0–44)
AST: 18 IU/L (ref 0–40)
Albumin: 4.4 g/dL (ref 3.5–4.7)
BILIRUBIN, DIRECT: 0.19 mg/dL (ref 0.00–0.40)
Bilirubin Total: 0.7 mg/dL (ref 0.0–1.2)
TOTAL PROTEIN: 7.1 g/dL (ref 6.0–8.5)

## 2016-12-29 LAB — BASIC METABOLIC PANEL
BUN / CREAT RATIO: 11 (ref 10–24)
BUN: 14 mg/dL (ref 8–27)
CHLORIDE: 103 mmol/L (ref 96–106)
CO2: 26 mmol/L (ref 20–29)
Calcium: 9.6 mg/dL (ref 8.6–10.2)
Creatinine, Ser: 1.28 mg/dL — ABNORMAL HIGH (ref 0.76–1.27)
GFR calc Af Amer: 59 mL/min/{1.73_m2} — ABNORMAL LOW (ref >59)
GFR calc non Af Amer: 51 mL/min/{1.73_m2} — ABNORMAL LOW (ref >59)
GLUCOSE: 113 mg/dL — AB (ref 65–99)
Potassium: 4.8 mmol/L (ref 3.5–5.2)
SODIUM: 142 mmol/L (ref 134–144)

## 2016-12-29 LAB — MICROALBUMIN / CREATININE URINE RATIO
CREATININE, UR: 105.3 mg/dL
MICROALB/CREAT RATIO: 3.1 mg/g{creat} (ref 0.0–30.0)
MICROALBUM., U, RANDOM: 3.3 ug/mL

## 2016-12-31 ENCOUNTER — Encounter: Payer: Self-pay | Admitting: Family Medicine

## 2017-02-07 ENCOUNTER — Ambulatory Visit (INDEPENDENT_AMBULATORY_CARE_PROVIDER_SITE_OTHER): Payer: Medicare Other | Admitting: Family Medicine

## 2017-02-07 ENCOUNTER — Encounter: Payer: Self-pay | Admitting: Family Medicine

## 2017-02-07 VITALS — BP 148/90 | Ht 72.0 in | Wt 220.0 lb

## 2017-02-07 DIAGNOSIS — I10 Essential (primary) hypertension: Secondary | ICD-10-CM | POA: Diagnosis not present

## 2017-02-07 DIAGNOSIS — N528 Other male erectile dysfunction: Secondary | ICD-10-CM

## 2017-02-07 DIAGNOSIS — Z0001 Encounter for general adult medical examination with abnormal findings: Secondary | ICD-10-CM | POA: Diagnosis not present

## 2017-02-07 DIAGNOSIS — E0842 Diabetes mellitus due to underlying condition with diabetic polyneuropathy: Secondary | ICD-10-CM

## 2017-02-07 DIAGNOSIS — R413 Other amnesia: Secondary | ICD-10-CM

## 2017-02-07 DIAGNOSIS — Z0000 Encounter for general adult medical examination without abnormal findings: Secondary | ICD-10-CM

## 2017-02-07 NOTE — Progress Notes (Signed)
Subjective:    Patient ID: Daniel Mercado, male    DOB: January 01, 1932, 84 y.o.   MRN: [id number]  HPI AWV- Annual Wellness Visit  The patient was seen for their annual wellness visit. The patient's past medical history, surgical history, and family history were reviewed. Pertinent vaccines were reviewed ( tetanus, pneumonia, shingles, flu) The patient's medication list was reviewed and updated.  The height and weight were entered.  BMI recorded in electronic record elsewhere  Cognitive screening was completed. Outcome of Mini - Cog: MMSE 24/30   Falls /depression screening electronically recorded within record elsewhere. Had one fall this year. Fell on ice. No injury.   Current tobacco usage: yes - chewing tobacco (All patients who use tobacco were given written and verbal information on quitting)  Recent listing of emergency department/hospitalizations over the past year were reviewed.  current specialist the patient sees on a regular basis: none   Medicare annual wellness visit patient questionnaire was reviewed.  A written screening schedule for the patient for the next 5-10 years was given. Appropriate discussion of followup regarding next visit was discussed.       Review of Systems  Constitutional: Negative for activity change, appetite change and fever.  HENT: Negative for congestion and rhinorrhea.   Eyes: Negative for discharge.  Respiratory: Negative for cough and wheezing.   Cardiovascular: Negative for chest pain.  Gastrointestinal: Negative for abdominal pain, blood in stool and vomiting.  Genitourinary: Negative for difficulty urinating and frequency.  Musculoskeletal: Negative for neck pain.  Skin: Negative for rash.  Allergic/Immunologic: Negative for environmental allergies and food allergies.  Neurological: Negative for weakness and headaches.  Psychiatric/Behavioral: Negative for agitation.  All other systems reviewed and are negative.        Objective:   Physical Exam  Constitutional: He appears well-developed and well-nourished.  HENT:  Head: Normocephalic and atraumatic.  Right Ear: External ear normal.  Left Ear: External ear normal.  Nose: Nose normal.  Mouth/Throat: Oropharynx is clear and moist.  Eyes: Right eye exhibits no discharge. Left eye exhibits no discharge. No scleral icterus.  Neck: Normal range of motion. Neck supple. No thyromegaly present.  Cardiovascular: Normal rate, regular rhythm and normal heart sounds.  No murmur heard. Pulmonary/Chest: Effort normal and breath sounds normal. No respiratory distress. He has no wheezes.  Abdominal: Soft. Bowel sounds are normal. He exhibits no distension and no mass. There is no tenderness.  Genitourinary: Penis normal.  Genitourinary Comments: Prostate exam within normal limits  Musculoskeletal: Normal range of motion. He exhibits no edema.  Lymphadenopathy:    He has no cervical adenopathy.  Neurological: He is alert. He exhibits normal muscle tone. Coordination normal.  Skin: Skin is warm and dry. No erythema.  Psychiatric: He has a normal mood and affect. His behavior is normal. Judgment normal.          Assessment & Plan:  Impression wellness exam.  I discussed.  Exercise discussed.  Blood work discussed.  2.  Progressive memory loss.  Primarily short-term memory loss.  Though MMSE does reveal some other cognitive decline scored 24 out of 30 on MMSE.  Family history of dementia.  Long discussion held.  Not enough yet to declare dementia but may be coming.  Discussed at length numerous questions answered.  Not enough criteria yet to pursue brain scan blood work medication intervention etc.  3.  Erectile dysfunction.  Discussed.  Patient would prefer to hold off on medication  Follow-up regular visit.  Diet exercise discussed

## 2017-03-02 DIAGNOSIS — Z7984 Long term (current) use of oral hypoglycemic drugs: Secondary | ICD-10-CM | POA: Diagnosis not present

## 2017-03-02 DIAGNOSIS — Z961 Presence of intraocular lens: Secondary | ICD-10-CM | POA: Diagnosis not present

## 2017-03-02 DIAGNOSIS — E119 Type 2 diabetes mellitus without complications: Secondary | ICD-10-CM | POA: Diagnosis not present

## 2017-03-02 LAB — HM DIABETES EYE EXAM

## 2017-03-12 ENCOUNTER — Encounter: Payer: Self-pay | Admitting: *Deleted

## 2017-05-17 IMAGING — CT CT RENAL STONE PROTOCOL
2 of 7 series · 14 of 46 positions shown, 18 images · non-contrast
Comparison: None.

CLINICAL DATA: Left-sided flank pain and nausea since this morning.

EXAM:
CT ABDOMEN AND PELVIS WITHOUT CONTRAST
TECHNIQUE: Multidetector CT imaging of the abdomen and pelvis was performed
following the standard protocol without IV contrast.

[Series 2: standard/full over (age)lbs 5.0 · axial · 0.77mm/px · z∈[-574,-154]mm · 11 of 99 slices shown, 15 images]
[im 10/99  soft-tissue]
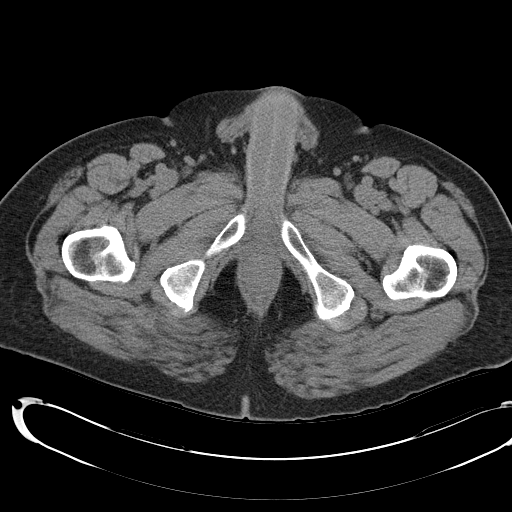
[im 10/99  bone]
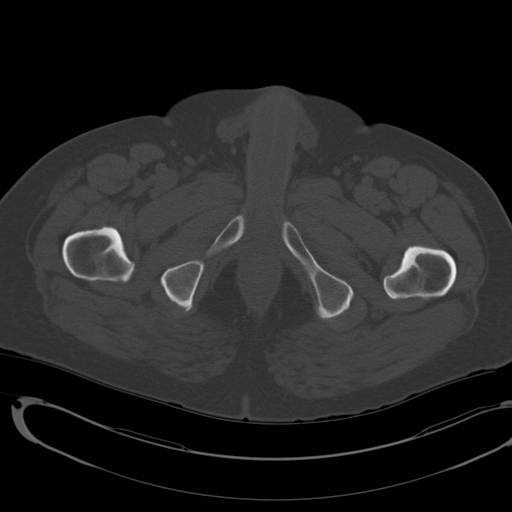
[im 19/99  soft-tissue]
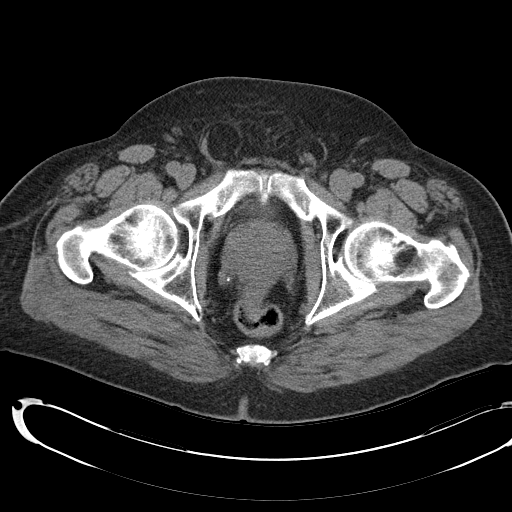
[im 29/99  soft-tissue]
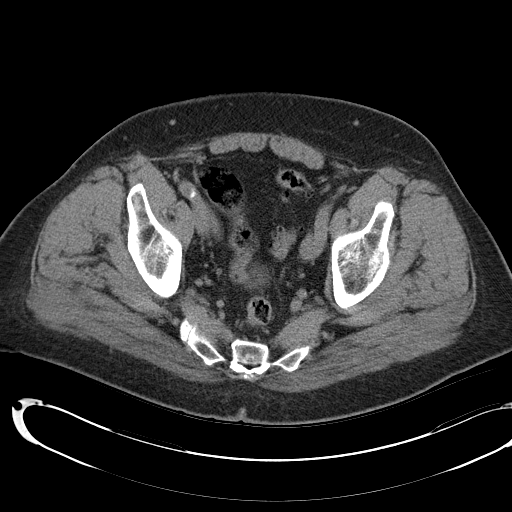
[im 38/99  soft-tissue]
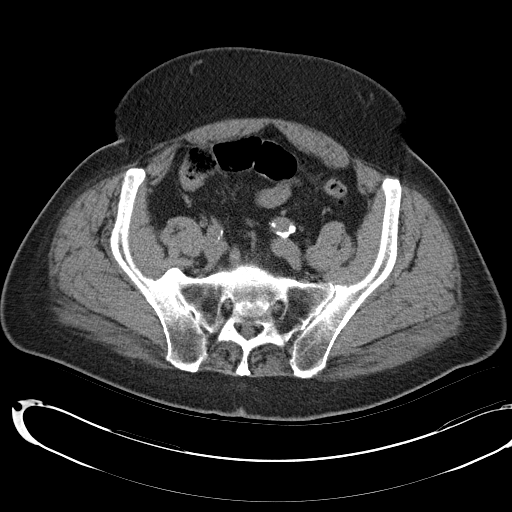
[im 52/99  soft-tissue]
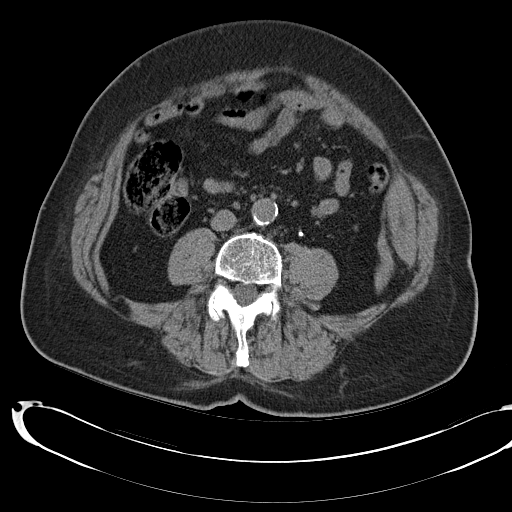
[im 61/99  soft-tissue]
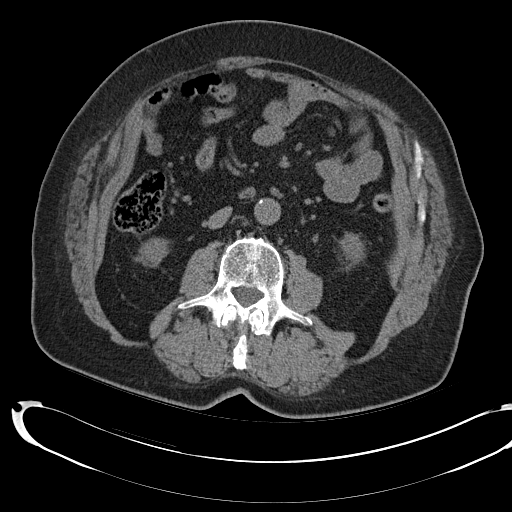
[im 71/99  soft-tissue]
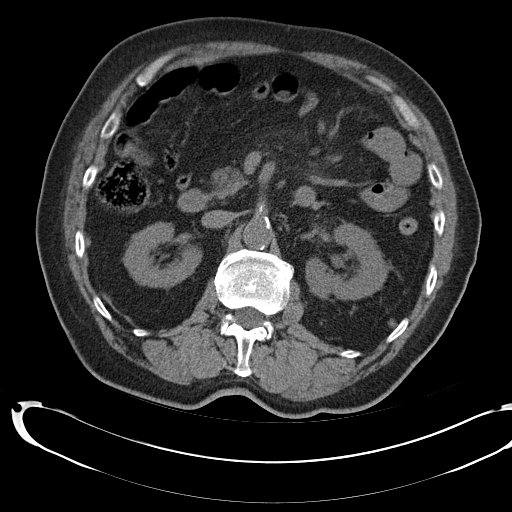
[im 80/99  soft-tissue]
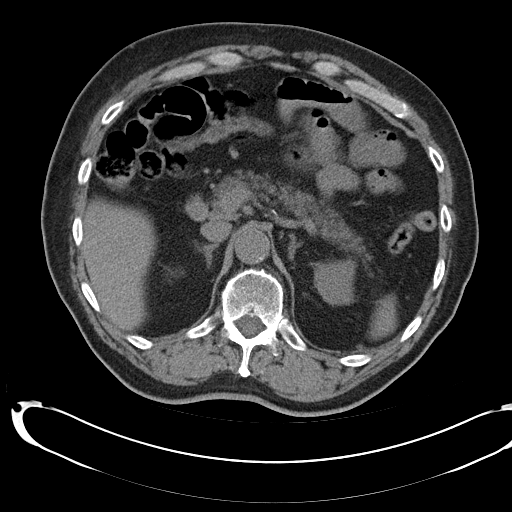
[im 80/99  lung]
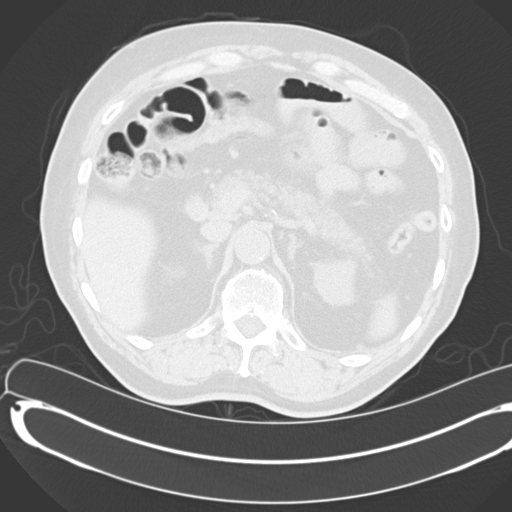
[im 85/99  lung]
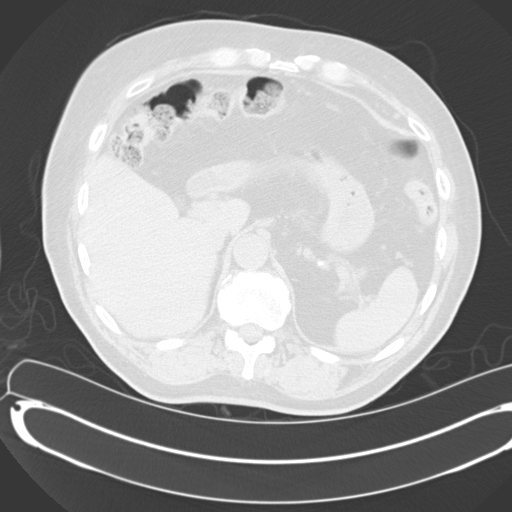
[im 89/99  soft-tissue]
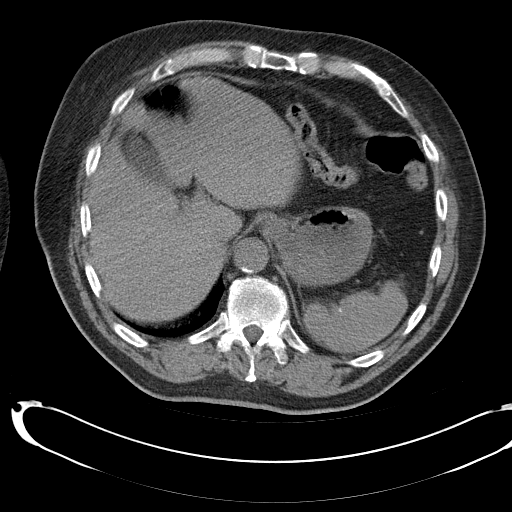
[im 89/99  lung]
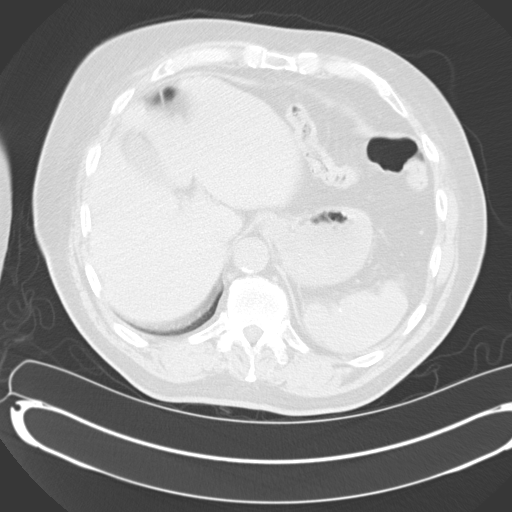
[im 89/99  bone]
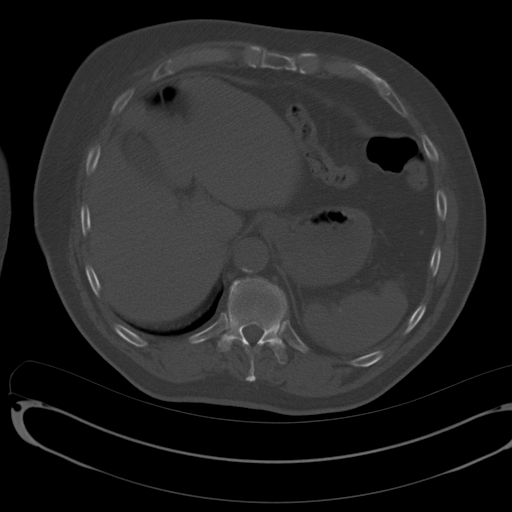
[im 94/99  lung]
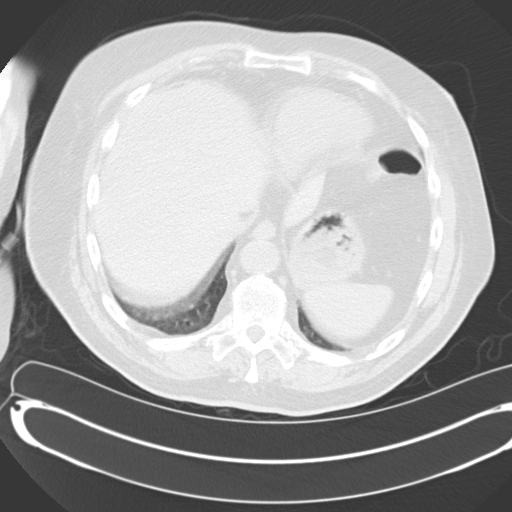

[Series 6: mpr coronal · coronal · 0.73mm/px · 3 of 98 slices shown]
[im 25/98  soft-tissue]
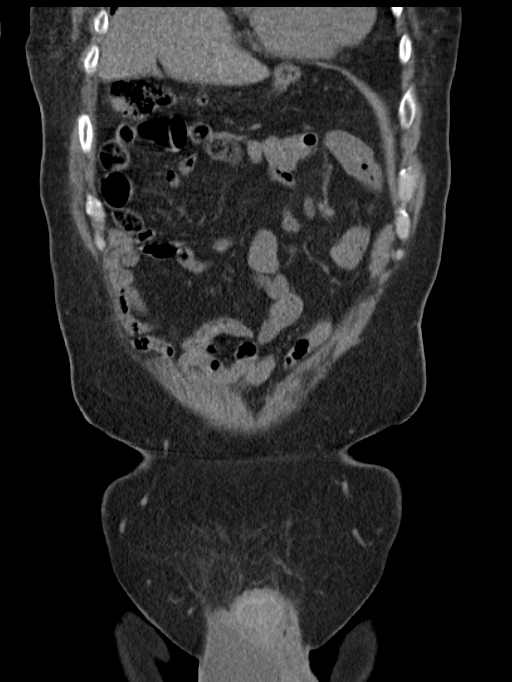
[im 49/98  soft-tissue]
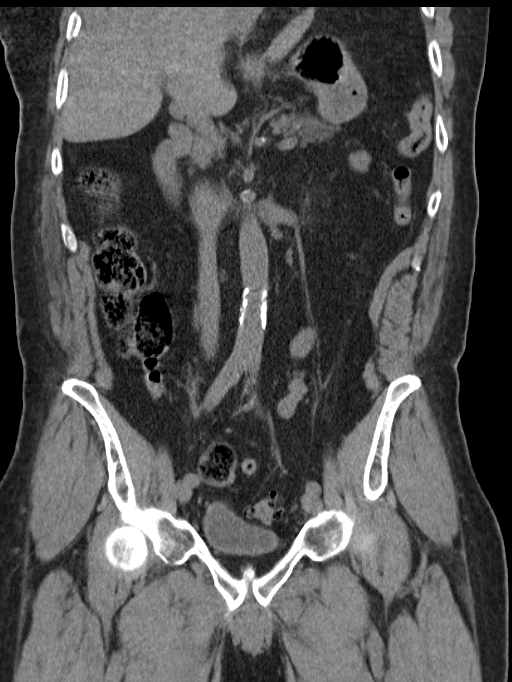
[im 73/98  soft-tissue]
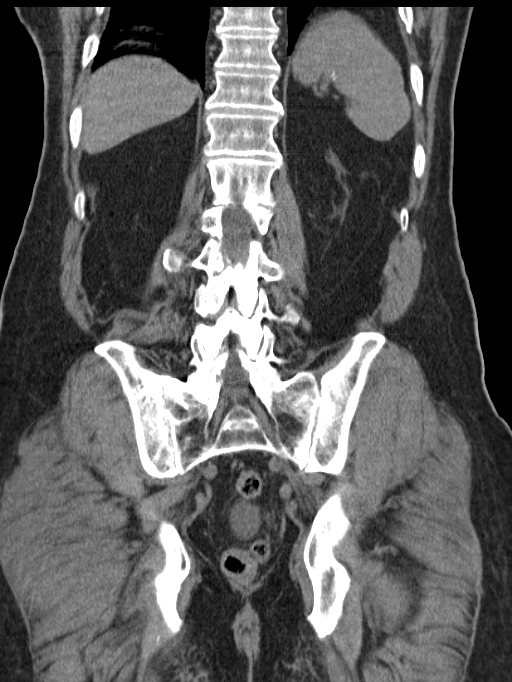

[14 of 46 positions shown; findings below may reference images not displayed]

FINDINGS: There is at least partially obstructing 7.3 mm calculus within the
proximal to mid left ureter, with mild dilation of the left ureter
and mild left hydronephrosis. Perirenal left fat stranding is also
seen, however it is not significantly different from the
counterpart.

No intrarenal or proximal ureteral calculi on the right side. No
evidence of hydronephrosis or other secondary signs of upper urinary
tract obstruction. Within limits of unenhanced technique, normal
appearing right kidney.

Again, within limits of unenhanced technique, remaining visualized
upper abdomen unremarkable. Visualized extreme lung bases clear.

No distal ureteral calculi on either side.

Evaluation of the osseous structures demonstrates compression
deformity of L2 vertebral body with approximately 40% height loss.
The attenuation of the bony matrix of L2 is heterogeneous, which may
represent a presence of osseous hemangioma or a lytic lesion.
Osteoarthritic changes of the lower lumbosacral spine as seen.

There is partially visualized bilateral fat containing indirect
inguinal hernias, and right more than left hydrocele
IMPRESSION: At least partially obstructing 7.3 mm calculus within the proximal
to mid left ureter, with associated mild left hydroureter and left
hydronephrosis.

No evidence of nephrolithiasis.

Compression deformity of L2 vertebral body, with presence of osseous
hemangioma or lytic lesion within it. Clinical correlation is
recommended. If concern for malignancy, bone scan may be considered.

Bilateral hydrocele.

Bilateral fat containing indirect inguinal hernias.

## 2017-05-18 ENCOUNTER — Ambulatory Visit (INDEPENDENT_AMBULATORY_CARE_PROVIDER_SITE_OTHER): Payer: Medicare Other | Admitting: Family Medicine

## 2017-05-18 ENCOUNTER — Encounter: Payer: Self-pay | Admitting: Family Medicine

## 2017-05-18 VITALS — BP 130/86 | Temp 97.6°F | Ht 72.0 in | Wt 217.0 lb

## 2017-05-18 DIAGNOSIS — R350 Frequency of micturition: Secondary | ICD-10-CM | POA: Diagnosis not present

## 2017-05-18 DIAGNOSIS — N410 Acute prostatitis: Secondary | ICD-10-CM | POA: Diagnosis not present

## 2017-05-18 LAB — POCT URINALYSIS DIPSTICK
Spec Grav, UA: >=1.03 — AB (ref 1.010–1.025)
pH, UA: 6 (ref 5.0–8.0)

## 2017-05-18 MED ORDER — CIPROFLOXACIN HCL 500 MG PO TABS: ORAL_TABLET | ORAL | 0 refills | Status: DC

## 2017-05-18 NOTE — Progress Notes (Signed)
   Subjective:    Patient ID: Daniel Mercado, male    DOB: 1931/12/29, 84 y.o.   MRN: [id number]  Urinary Tract Infection   This is a new problem. The current episode started in the past 7 days. Associated symptoms include urgency. Associated symptoms comments: Getting up at night 3-4 times. He has tried nothing for the symptoms.   Results for orders placed or performed in visit on 05/18/17  POCT Urinalysis Dipstick  Result Value Ref Range   Color, UA     Clarity, UA     Glucose, UA     Bilirubin, UA     Ketones, UA +++    Spec Grav, UA >=1.030 (A) 1.010 - 1.025   Blood, UA +    pH, UA 6.0 5.0 - 8.0   Protein, UA     Urobilinogen, UA  0.2 or 1.0 E.U./dL   Nitrite, UA     Leukocytes, UA  Negative   Appearance     Odor     Pos burning and pain   Pos incer frequency  Got to go quickkly     No hx of kidney or bladder infxh   Review of Systems  Genitourinary: Positive for urgency.       Objective:   Physical Exam  Alert vitals stable, NAD. Blood pressure good on repeat. HEENT normal. Lungs clear. Heart regular rate and rhythm. Prostate tenderpros inflamed boggy Urinalysis occasional white blood cell impression     Assessment & Plan:  1.  Impression impression acute prostatitis.  Discussed.  Cipro twice daily for 21 days symptom care discussed warning signs discussed

## 2017-07-17 ENCOUNTER — Other Ambulatory Visit: Payer: Self-pay | Admitting: Family Medicine

## 2017-08-08 ENCOUNTER — Encounter: Payer: Self-pay | Admitting: Family Medicine

## 2017-08-08 ENCOUNTER — Ambulatory Visit (INDEPENDENT_AMBULATORY_CARE_PROVIDER_SITE_OTHER): Payer: Medicare Other | Admitting: Family Medicine

## 2017-08-08 VITALS — BP 144/92 | Ht 72.0 in | Wt 212.4 lb

## 2017-08-08 DIAGNOSIS — E0842 Diabetes mellitus due to underlying condition with diabetic polyneuropathy: Secondary | ICD-10-CM | POA: Diagnosis not present

## 2017-08-08 DIAGNOSIS — M1000 Idiopathic gout, unspecified site: Secondary | ICD-10-CM

## 2017-08-08 DIAGNOSIS — I10 Essential (primary) hypertension: Secondary | ICD-10-CM

## 2017-08-08 DIAGNOSIS — E119 Type 2 diabetes mellitus without complications: Secondary | ICD-10-CM

## 2017-08-08 DIAGNOSIS — R350 Frequency of micturition: Secondary | ICD-10-CM | POA: Diagnosis not present

## 2017-08-08 LAB — POCT GLYCOSYLATED HEMOGLOBIN (HGB A1C): HEMOGLOBIN A1C: 5.6 % (ref 4.0–5.6)

## 2017-08-08 MED ORDER — ALLOPURINOL 100 MG PO TABS: 200.0000 mg | ORAL_TABLET | Freq: Every day | ORAL | 1 refills | Status: DC

## 2017-08-08 MED ORDER — GLIPIZIDE ER 2.5 MG PO TB24: ORAL_TABLET | ORAL | 1 refills | Status: DC

## 2017-08-08 MED ORDER — TAMSULOSIN HCL 0.4 MG PO CAPS: ORAL_CAPSULE | ORAL | 1 refills | Status: DC

## 2017-08-08 NOTE — Patient Instructions (Signed)
Results for orders placed or performed in visit on 08/08/17  POCT HgB A1C  Result Value Ref Range   Hemoglobin A1C 5.6 4.0 - 5.6 %   HbA1c POC (<> result, manual entry)  4.0 - 5.6 %   HbA1c, POC (prediabetic range)  5.7 - 6.4 %   HbA1c, POC (controlled diabetic range)  0.0 - 7.0 %

## 2017-08-08 NOTE — Progress Notes (Signed)
   Subjective:    Patient ID: Daniel Mercado, male    DOB: [birth year], 84 y.o.   MRN: [id number]  Diabetes  He presents for his follow-up diabetic visit. He has type 2 diabetes mellitus. There are no hypoglycemic associated symptoms. There are no diabetic associated symptoms. There are no hypoglycemic complications. There are no diabetic complications.  Hypertension  This is a chronic problem. There are no compliance problems.    Results for orders placed or performed in visit on 08/08/17  POCT HgB A1C  Result Value Ref Range   Hemoglobin A1C 5.6 4.0 - 5.6 %   HbA1c POC (<> result, manual entry)  4.0 - 5.6 %   HbA1c, POC (prediabetic range)  5.7 - 6.4 %   HbA1c, POC (controlled diabetic range)  0.0 - 7.0 %   Glu fast low 100s  Gout stable, no flares on the med.  Compliant with allopurinol.  No noticeable side effects.  No further attacks of gout.  Urinating once per night , tking the flomax faithfully.  No major side effects from the medication.  States it has definitely helped his urinating.   [place]  No major exercise otherwise  Patient claims compliance with diabetes medication. No obvious side effects. Reports no substantial low sugar spells. Most numbers are generally in good range when checked fasting. Generally does not miss a dose of medication. Watching diabetic diet closely  Blood pressure medicine and blood pressure levels reviewed today with patient. Compliant with blood pressure medicine. States does not miss a dose. No obvious side effects. Blood pressure generally good when checked elsewhere. Watching salt intake.     Review of Systems No headache, no major weight loss or weight gain, no chest pain no back pain abdominal pain no change in bowel habits complete ROS otherwise negative     Objective:   Physical Exam  Alert and oriented, vitals reviewed and stable, NAD ENT-TM's and ext canals WNL bilat via otoscopic exam Soft palate, tonsils and post  pharynx WNL via oropharyngeal exam Neck-symmetric, no masses; thyroid nonpalpable and nontender Pulmonary-no tachypnea or accessory muscle use; Clear without wheezes via auscultation Card--no abnrml murmurs, rhythm reg and rate WNL Carotid pulses symmetric, without bruits       Assessment & Plan:  Impression 1 type diabetes.  Control.  Discussed.  To maintain same meds.  Compliance discussed.  Diet discussed.  2.  Gout.  Chronic stable.  Handling meds well to maintain same her graph #3 essential hypertension.  Blood pressure control good without medication.  Discussed.  To maintain low-salt approach.  4.  Prostate hypertrophy.  Flomax continues to help no obvious side effects patient maintain  Discussed.  Diet discussed exercise discussed.  Medication refilled follow-up in 6 months for wellness plus chronic

## 2017-10-29 DIAGNOSIS — Z23 Encounter for immunization: Secondary | ICD-10-CM | POA: Diagnosis not present

## 2018-01-21 ENCOUNTER — Encounter: Payer: Self-pay | Admitting: Family Medicine

## 2018-01-21 ENCOUNTER — Ambulatory Visit (INDEPENDENT_AMBULATORY_CARE_PROVIDER_SITE_OTHER): Payer: Medicare Other | Admitting: Family Medicine

## 2018-01-21 VITALS — BP 138/86 | Ht 72.0 in | Wt 215.4 lb

## 2018-01-21 DIAGNOSIS — E0842 Diabetes mellitus due to underlying condition with diabetic polyneuropathy: Secondary | ICD-10-CM | POA: Diagnosis not present

## 2018-01-21 DIAGNOSIS — I10 Essential (primary) hypertension: Secondary | ICD-10-CM

## 2018-01-21 DIAGNOSIS — M1000 Idiopathic gout, unspecified site: Secondary | ICD-10-CM

## 2018-01-21 DIAGNOSIS — E119 Type 2 diabetes mellitus without complications: Secondary | ICD-10-CM

## 2018-01-21 DIAGNOSIS — Z1329 Encounter for screening for other suspected endocrine disorder: Secondary | ICD-10-CM | POA: Diagnosis not present

## 2018-01-21 DIAGNOSIS — E538 Deficiency of other specified B group vitamins: Secondary | ICD-10-CM

## 2018-01-21 LAB — POCT GLYCOSYLATED HEMOGLOBIN (HGB A1C): Hemoglobin A1C: 6 % — AB (ref 4.0–5.6)

## 2018-01-21 MED ORDER — ALLOPURINOL 100 MG PO TABS: 200.0000 mg | ORAL_TABLET | Freq: Every day | ORAL | 1 refills | Status: DC

## 2018-01-21 MED ORDER — GLIPIZIDE ER 2.5 MG PO TB24: ORAL_TABLET | ORAL | 1 refills | Status: DC

## 2018-01-21 MED ORDER — TAMSULOSIN HCL 0.4 MG PO CAPS: ORAL_CAPSULE | ORAL | 1 refills | Status: DC

## 2018-01-21 NOTE — Progress Notes (Signed)
   Subjective:    Patient ID: Daniel Mercado, male    DOB: 1931/06/01, 85 y.o.   MRN: [id number] Patient arrives with numerous concerns Diabetes  He presents for his follow-up diabetic visit. He has type 2 diabetes mellitus. There are no hypoglycemic associated symptoms. There are no diabetic associated symptoms. There are no hypoglycemic complications. There are no diabetic complications. He does not see a podiatrist.Eye exam current: has appt with eye dr in Feb.    Results for orders placed or performed in visit on 01/21/18  POCT HgB A1C  Result Value Ref Range   Hemoglobin A1C 6.0 (A) 4.0 - 5.6 %   HbA1c POC (<> result, manual entry)     HbA1c, POC (prediabetic range)     HbA1c, POC (controlled diabetic range)     Patient claims compliance with diabetes medication. No obvious side effects. Reports no substantial low sugar spells. Most numbers are generally in good range when checked fasting. Generally does not miss a dose of medication. Watching diabetic diet closely  Morn numbers good   No low sugar spells    Pt notes neck pain, disd a lot of activity, neck got stiff, neck now better. Was  Working on the tractore  Does not recall injuring heel. Fairly quickly in the morn feel s the pain  Urinating going fairly good, flomax overall helping  No major s e's    Left heel now acting up, worse with pressure.  Recalls no sudden  Blood pressures generally running good  Compliant with gout medicine.  No recurrence of gout Review of Systems No headache, no major weight loss or weight gain, no chest pain no back pain abdominal pain no change in bowel habits complete ROS otherwise negative     Objective:   Physical Exam  Alert and oriented, vitals reviewed and stable, NAD ENT-TM's and ext canals WNL bilat via otoscopic exam Soft palate, tonsils and post pharynx WNL via oropharyngeal exam Neck-symmetric, no masses; thyroid nonpalpable and nontender Pulmonary-no tachypnea or  accessory muscle use; Clear without wheezes via auscultation Card--no abnrml murmurs, rhythm reg and rate WNL Carotid pulses symmetric, without bruits See diabetic foot exam      Assessment & Plan:  Impression 1 type 2 diabetes good control discussed maintain same meds  2.  Essential hypertension blood pressure good with no meds discussed  3.  Gout clinically stable  4.  Prostate hypertrophy clinically stable  5.  Left heel plantar fasciitis.  Heel cups recommended.  No medicines rationale discussed  At the end of the visit patient concerned about her memory return in 2 weeks for MMSE plus evaluation

## 2018-01-22 LAB — CBC WITH DIFFERENTIAL/PLATELET
Basophils Absolute: 0 10*3/uL (ref 0.0–0.2)
Basos: 1 %
EOS (ABSOLUTE): 0.2 10*3/uL (ref 0.0–0.4)
Eos: 4 %
Hematocrit: 45.9 % (ref 37.5–51.0)
Hemoglobin: 16 g/dL (ref 13.0–17.7)
Immature Grans (Abs): 0 10*3/uL (ref 0.0–0.1)
Immature Granulocytes: 0 %
LYMPHS ABS: 1.9 10*3/uL (ref 0.7–3.1)
Lymphs: 35 %
MCH: 31.9 pg (ref 26.6–33.0)
MCHC: 34.9 g/dL (ref 31.5–35.7)
MCV: 92 fL (ref 79–97)
Monocytes Absolute: 0.4 10*3/uL (ref 0.1–0.9)
Monocytes: 7 %
Neutrophils Absolute: 2.9 10*3/uL (ref 1.4–7.0)
Neutrophils: 53 %
Platelets: 183 10*3/uL (ref 150–450)
RBC: 5.01 x10E6/uL (ref 4.14–5.80)
RDW: 13.4 % (ref 11.6–15.4)
WBC: 5.4 10*3/uL (ref 3.4–10.8)

## 2018-01-22 LAB — MICROALBUMIN / CREATININE URINE RATIO

## 2018-01-22 LAB — HEPATIC FUNCTION PANEL
ALT: 16 IU/L (ref 0–44)
AST: 20 IU/L (ref 0–40)
Albumin: 4.3 g/dL (ref 3.5–4.7)
Alkaline Phosphatase: 62 IU/L (ref 39–117)
BILIRUBIN TOTAL: 0.8 mg/dL (ref 0.0–1.2)
BILIRUBIN, DIRECT: 0.25 mg/dL (ref 0.00–0.40)
Total Protein: 7.3 g/dL (ref 6.0–8.5)

## 2018-01-22 LAB — TSH: TSH: 13.2 u[IU]/mL — ABNORMAL HIGH (ref 0.450–4.500)

## 2018-01-22 LAB — VITAMIN B12: Vitamin B-12: 248 pg/mL (ref 232–1245)

## 2018-01-22 LAB — BASIC METABOLIC PANEL
BUN / CREAT RATIO: 14 (ref 10–24)
BUN: 18 mg/dL (ref 8–27)
CO2: 24 mmol/L (ref 20–29)
Calcium: 9.7 mg/dL (ref 8.6–10.2)
Chloride: 103 mmol/L (ref 96–106)
Creatinine, Ser: 1.25 mg/dL (ref 0.76–1.27)
GFR calc non Af Amer: 53 mL/min/{1.73_m2} — ABNORMAL LOW (ref >59)
GFR, EST AFRICAN AMERICAN: 61 mL/min/{1.73_m2} (ref >59)
GLUCOSE: 105 mg/dL — AB (ref 65–99)
Potassium: 4.4 mmol/L (ref 3.5–5.2)
Sodium: 143 mmol/L (ref 134–144)

## 2018-01-22 LAB — FOLATE: Folate: 6.2 ng/mL (ref >3.0)

## 2018-02-05 ENCOUNTER — Ambulatory Visit (INDEPENDENT_AMBULATORY_CARE_PROVIDER_SITE_OTHER): Payer: Medicare Other | Admitting: Family Medicine

## 2018-02-05 ENCOUNTER — Encounter: Payer: Self-pay | Admitting: Family Medicine

## 2018-02-05 ENCOUNTER — Telehealth: Payer: Self-pay | Admitting: Family Medicine

## 2018-02-05 VITALS — BP 130/72 | Ht 72.0 in | Wt 215.0 lb

## 2018-02-05 DIAGNOSIS — R5383 Other fatigue: Secondary | ICD-10-CM

## 2018-02-05 DIAGNOSIS — E039 Hypothyroidism, unspecified: Secondary | ICD-10-CM | POA: Insufficient documentation

## 2018-02-05 DIAGNOSIS — R413 Other amnesia: Secondary | ICD-10-CM | POA: Diagnosis not present

## 2018-02-05 DIAGNOSIS — E038 Other specified hypothyroidism: Secondary | ICD-10-CM | POA: Diagnosis not present

## 2018-02-05 MED ORDER — LEVOTHYROXINE SODIUM 50 MCG PO TABS: ORAL_TABLET | ORAL | 3 refills | Status: DC

## 2018-02-05 NOTE — Telephone Encounter (Signed)
Discussed all with wife and she verbalized understanding. cipro removed from med list

## 2018-02-05 NOTE — Progress Notes (Signed)
Subjective:    Patient ID: Daniel Mercado, male    DOB: 1931-08-01, 85 y.o.   MRN: [id number]  HPIpt arrives to discuss memory issues. No other concerns today.  MMSE done today. Score 25/30   Having trouble remembering peoples names  Forgetting more than befor e''  Daily tasks not forgetting  Wife tells him he doesn't pay attention   Sometimes with driving in a strange placd gets more losing of his bearings at times in [place] and such, not wnting to go down to   Results for orders placed or performed in visit on 01/21/18  Folate  Result Value Ref Range   Folate 6.2 >3.0 ng/mL  B12  Result Value Ref Range   Vitamin B-12 248 232 - 1,245 pg/mL  TSH  Result Value Ref Range   TSH 13.200 (H) 0.450 - 4.500 uIU/mL  Hepatic function panel  Result Value Ref Range   Total Protein 7.3 6.0 - 8.5 g/dL   Albumin 4.3 3.5 - 4.7 g/dL   Bilirubin Total 0.8 0.0 - 1.2 mg/dL   Bilirubin, Direct 0.25 0.00 - 0.40 mg/dL   Alkaline Phosphatase 62 39 - 117 IU/L   AST 20 0 - 40 IU/L   ALT 16 0 - 44 IU/L  Basic Metabolic Panel (BMET)  Result Value Ref Range   Glucose 105 (H) 65 - 99 mg/dL   BUN 18 8 - 27 mg/dL   Creatinine, Ser 1.25 0.76 - 1.27 mg/dL   GFR calc non Af Amer 53 (L) >59 mL/min/1.73   GFR calc Af Amer 61 >59 mL/min/1.73   BUN/Creatinine Ratio 14 10 - 24   Sodium 143 134 - 144 mmol/L   Potassium 4.4 3.5 - 5.2 mmol/L   Chloride 103 96 - 106 mmol/L   CO2 24 20 - 29 mmol/L   Calcium 9.7 8.6 - 10.2 mg/dL  CBC with Differential  Result Value Ref Range   WBC 5.4 3.4 - 10.8 x10E3/uL   RBC 5.01 4.14 - 5.80 x10E6/uL   Hemoglobin 16.0 13.0 - 17.7 g/dL   Hematocrit 45.9 37.5 - 51.0 %   MCV 92 79 - 97 fL   MCH 31.9 26.6 - 33.0 pg   MCHC 34.9 31.5 - 35.7 g/dL   RDW 13.4 11.6 - 15.4 %   Platelets 183 150 - 450 x10E3/uL   Neutrophils 53 Not Estab. %   Lymphs 35 Not Estab. %   Monocytes 7 Not Estab. %   Eos 4 Not Estab. %   Basos 1 Not Estab. %   Neutrophils Absolute 2.9 1.4 - 7.0  x10E3/uL   Lymphocytes Absolute 1.9 0.7 - 3.1 x10E3/uL   Monocytes Absolute 0.4 0.1 - 0.9 x10E3/uL   EOS (ABSOLUTE) 0.2 0.0 - 0.4 x10E3/uL   Basophils Absolute 0.0 0.0 - 0.2 x10E3/uL   Immature Granulocytes 0 Not Estab. %   Immature Grans (Abs) 0.0 0.0 - 0.1 x10E3/uL  Urine Microalbumin w/creat. ratio  Result Value Ref Range   Creatinine, Urine CANCELED mg/dL  POCT HgB A1C  Result Value Ref Range   Hemoglobin A1C 6.0 (A) 4.0 - 5.6 %   HbA1c POC (<> result, manual entry)     HbA1c, POC (prediabetic range)     HbA1c, POC (controlled diabetic range)     Pt is on tthryoid med(his son) No oersonal history of thyroid troubles  Positive history of low thyroid & overactive thyroid wife.         Pt noting a  bit more fatigue these day   Review of Systems No headache, no major weight loss or weight gain, no chest pain no back pain abdominal pain no change in bowel habits complete ROS otherwise negative     Objective:   Physical Exam  Alert and oriented, vitals reviewed and stable, NAD ENT-TM's and ext canals WNL bilat via otoscopic exam Soft palate, tonsils and post pharynx WNL via oropharyngeal exam Neck-symmetric, no masses; thyroid nonpalpable and nontender Pulmonary-no tachypnea or accessory muscle use; Clear without wheezes via auscultation Card--no abnrml murmurs, rhythm reg and rate WNL Carotid pulses symmetric, without bruits Thyroid nonpalpable      Assessment & Plan:  Impression 1 short-term memory loss.  25 out of 30 on on MSE.  Long discussion held in this regard.  Positive family history of dementia.  Patient has been noticing symptoms develop over the past couple years.  Patient's family concerned also.  Has not become lost while driving but occasionally disoriented.  When in strange places such as [place] per more challenges with the names and other things he feels too early for medication.  ~Diagnosed dementia.  50% risk of diagnosis of dementia notes.   Potentially for dose older than 80 are discussed.  We will watch this at intervals.  The family likes neurology visit will be happy to schedule.  2.  Hypothyroidism 3 discussed.  Time for intervention.  Normal add medication.  This may or may not help with hearing discussed at length.  Proper use discussed.  TSH in 3 months.  Greater than 50% of this 40 minute face to face visit was spent in counseling and discussion and coordination of care regarding the above diagnosis/diagnosies

## 2018-02-05 NOTE — Telephone Encounter (Signed)
Pt's wife called, would like some clarification on today's visit (DPR is on file)  Pt's after visit summary shows that pt is on levothyroxine (SYNTHROID, LEVOTHROID) 50 MCG tablet   Pt's wife would like to know if this is something new?  Pt told his wife Dr. Richardson Landry stated that pt has hypothyroidism.  Pt's wife would like the ciprofloxacin (CIPRO) 500 MG tablet removed from med list as pt is not currently taking this  Pt's wife would also like clarification on pt's short term memory.  Pt told his wife that Dr. Richardson Landry stated this in not a serious issue at this time but it was the pt's understanding that Dr. Richardson Landry was ordering a medication for the short term memory.    Please clarify & call pt's wife [phone]

## 2018-02-05 NOTE — Telephone Encounter (Signed)
Pt does have mild hypothyroidism, may or may not be contributing to memory issues  We generally do not ck thyroid functions on men so likely coming on for awhile  I advised the pt i'm always concerned when memory starts slippoing in my older folks, but he did well enough on the MMSE testing that I cannot at this time call it early dementia. I told him we'd re evaluate this periodically as we follow him up  I also advised him that with his family history there is certainly a risjk of him progressing to demen tia  I also advised him that a full 50 % of those who live to be older than 71 are eventually diagnosed with dementia  Also advised him that after speaking with you if you all wanted an experts evaluation in this area we could set up with a neurologist  Remove cipro

## 2018-02-05 NOTE — Patient Instructions (Addendum)
You do have issues of short term memory loss, but not enough yet to lead to a work up for dementia. We will be keeping an eye on this, but for now I recommend no further testing. If you decide you would like to take this further now, get back with [place] and I can set you up a visit with a neurologist.  Getting your thyroid function back up to full speed might also help your memory issues some  Please do follow up blood work in three months

## 2018-03-04 DIAGNOSIS — Z7984 Long term (current) use of oral hypoglycemic drugs: Secondary | ICD-10-CM | POA: Diagnosis not present

## 2018-03-04 DIAGNOSIS — E119 Type 2 diabetes mellitus without complications: Secondary | ICD-10-CM | POA: Diagnosis not present

## 2018-03-04 DIAGNOSIS — Z961 Presence of intraocular lens: Secondary | ICD-10-CM | POA: Diagnosis not present

## 2018-05-06 DIAGNOSIS — R5383 Other fatigue: Secondary | ICD-10-CM | POA: Diagnosis not present

## 2018-05-07 LAB — TSH: TSH: 6.24 u[IU]/mL — ABNORMAL HIGH (ref 0.450–4.500)

## 2018-05-10 MED ORDER — LEVOTHYROXINE SODIUM 75 MCG PO TABS: 75.0000 ug | ORAL_TABLET | Freq: Every day | ORAL | 1 refills | Status: DC

## 2018-05-10 NOTE — Addendum Note (Signed)
Addended by: Vicente Males on: 05/10/2018 11:59 AM   Modules accepted: Orders

## 2018-06-17 ENCOUNTER — Other Ambulatory Visit: Payer: Self-pay | Admitting: Sports Medicine

## 2018-06-17 ENCOUNTER — Other Ambulatory Visit (HOSPITAL_COMMUNITY): Payer: Self-pay | Admitting: Sports Medicine

## 2018-06-17 DIAGNOSIS — S32020A Wedge compression fracture of second lumbar vertebra, initial encounter for closed fracture: Secondary | ICD-10-CM

## 2018-06-17 DIAGNOSIS — M545 Low back pain, unspecified: Secondary | ICD-10-CM

## 2018-06-17 DIAGNOSIS — S32002D Unstable burst fracture of unspecified lumbar vertebra, subsequent encounter for fracture with routine healing: Secondary | ICD-10-CM | POA: Diagnosis not present

## 2018-06-21 ENCOUNTER — Ambulatory Visit (HOSPITAL_COMMUNITY)
Admission: RE | Admit: 2018-06-21 | Discharge: 2018-06-21 | Disposition: A | Discharge status: Home / Self Care | Payer: Medicare Other | Source: Ambulatory Visit | Attending: Sports Medicine | Admitting: Sports Medicine

## 2018-06-21 ENCOUNTER — Other Ambulatory Visit: Payer: Self-pay

## 2018-06-21 DIAGNOSIS — S32020A Wedge compression fracture of second lumbar vertebra, initial encounter for closed fracture: Secondary | ICD-10-CM

## 2018-06-21 DIAGNOSIS — M545 Low back pain, unspecified: Secondary | ICD-10-CM

## 2018-06-24 DIAGNOSIS — S32002D Unstable burst fracture of unspecified lumbar vertebra, subsequent encounter for fracture with routine healing: Secondary | ICD-10-CM | POA: Diagnosis not present

## 2018-06-24 DIAGNOSIS — M545 Low back pain: Secondary | ICD-10-CM | POA: Diagnosis not present

## 2018-07-31 DIAGNOSIS — E038 Other specified hypothyroidism: Secondary | ICD-10-CM | POA: Diagnosis not present

## 2018-08-01 LAB — TSH: TSH: 1.93 u[IU]/mL (ref 0.450–4.500)

## 2018-08-05 ENCOUNTER — Other Ambulatory Visit: Payer: Self-pay

## 2018-08-05 MED ORDER — LEVOTHYROXINE SODIUM 75 MCG PO TABS: 75.0000 ug | ORAL_TABLET | Freq: Every day | ORAL | 3 refills | Status: DC

## 2018-08-06 ENCOUNTER — Ambulatory Visit (INDEPENDENT_AMBULATORY_CARE_PROVIDER_SITE_OTHER): Payer: Medicare Other | Admitting: Family Medicine

## 2018-08-06 ENCOUNTER — Other Ambulatory Visit: Payer: Self-pay

## 2018-08-06 VITALS — BP 134/78 | Temp 97.2°F | Ht 71.25 in | Wt 211.4 lb

## 2018-08-06 DIAGNOSIS — M1000 Idiopathic gout, unspecified site: Secondary | ICD-10-CM

## 2018-08-06 DIAGNOSIS — E038 Other specified hypothyroidism: Secondary | ICD-10-CM | POA: Diagnosis not present

## 2018-08-06 DIAGNOSIS — E119 Type 2 diabetes mellitus without complications: Secondary | ICD-10-CM

## 2018-08-06 DIAGNOSIS — I10 Essential (primary) hypertension: Secondary | ICD-10-CM | POA: Diagnosis not present

## 2018-08-06 DIAGNOSIS — Z0000 Encounter for general adult medical examination without abnormal findings: Secondary | ICD-10-CM | POA: Diagnosis not present

## 2018-08-06 LAB — POCT GLYCOSYLATED HEMOGLOBIN (HGB A1C): Hemoglobin A1C: 5.7 % — AB (ref 4.0–5.6)

## 2018-08-06 NOTE — Progress Notes (Signed)
Subjective:    Patient ID: [name], male    DOB: 11-03-1931, 85 y.o.   MRN: [id number]  HPI AWV- Annual Wellness Visit  The patient was seen for their annual wellness visit. The patient's past medical history, surgical history, and family history were reviewed. Pertinent vaccines were reviewed ( tetanus, pneumonia, shingles, flu) The patient's medication list was reviewed and updated.  The height and weight were entered.  BMI recorded in electronic record elsewhere  Cognitive screening was completed. Outcome of Mini - Cog: pass   Falls /depression screening electronically recorded within record elsewhere  Current tobacco usage: chews tobacco (All patients who use tobacco were given written and verbal information on quitting)  Recent listing of emergency department/hospitalizations over the past year were reviewed.  current specialist the patient sees on a regular basis: none   Medicare annual wellness visit patient questionnaire was reviewed.  A written screening schedule for the patient for the next 5-10 years was given. Appropriate discussion of followup regarding next visit was discussed.  Results for orders placed or performed in visit on 08/06/18  POCT glycosylated hemoglobin (Hb A1C)  Result Value Ref Range   Hemoglobin A1C 5.7 (A) 4.0 - 5.6 %   HbA1c POC (<> result, manual entry)     HbA1c, POC (prediabetic range)     HbA1c, POC (controlled diabetic range)      Patient claims compliance with diabetes medication. No obvious side effects. Reports no substantial low sugar spells. Most numbers are generally in good range when checked fasting. Generally does not miss a dose of medication. Watching diabetic diet closely  Patient compliant with his gout medicine.  Does not miss a dose.  Has modified his diet.  See recent changes in medications over the phone via concerning thyroid medicine.  Last TSH excellent.  No excess fatigue  Patient reports his urinary  frequency is still benefited by the Flomax.  Definitely wants to stay on.  No obvious dizziness with it.  Review of Systems  Constitutional: Negative for activity change, appetite change and fever.  HENT: Negative for congestion and rhinorrhea.   Eyes: Negative for discharge.  Respiratory: Negative for cough and wheezing.   Cardiovascular: Negative for chest pain.  Gastrointestinal: Negative for abdominal pain, blood in stool and vomiting.  Genitourinary: Negative for difficulty urinating and frequency.  Musculoskeletal: Negative for neck pain.  Skin: Negative for rash.  Allergic/Immunologic: Negative for environmental allergies and food allergies.  Neurological: Negative for weakness and headaches.  Psychiatric/Behavioral: Negative for agitation.  All other systems reviewed and are negative.      Objective:   Physical Exam Vitals signs reviewed.  Constitutional:      Appearance: He is well-developed.  HENT:     Head: Normocephalic and atraumatic.     Right Ear: External ear normal.     Left Ear: External ear normal.     Nose: Nose normal.  Eyes:     Pupils: Pupils are equal, round, and reactive to light.  Neck:     Musculoskeletal: Normal range of motion and neck supple.     Thyroid: No thyromegaly.  Cardiovascular:     Rate and Rhythm: Normal rate and regular rhythm.     Heart sounds: Normal heart sounds. No murmur.  Pulmonary:     Effort: Pulmonary effort is normal. No respiratory distress.     Breath sounds: Normal breath sounds. No wheezing.  Abdominal:     General: Bowel sounds are normal. There is  no distension.     Palpations: Abdomen is soft. There is no mass.     Tenderness: There is no abdominal tenderness.  Genitourinary:    Penis: Normal.   Musculoskeletal: Normal range of motion.  Lymphadenopathy:     Cervical: No cervical adenopathy.  Skin:    General: Skin is warm and dry.     Findings: No erythema.  Neurological:     Mental Status: He is alert.      Motor: No abnormal muscle tone.  Psychiatric:        Behavior: Behavior normal.        Judgment: Judgment normal.           Assessment & Plan:

## 2018-11-02 DIAGNOSIS — Z23 Encounter for immunization: Secondary | ICD-10-CM | POA: Diagnosis not present

## 2018-12-11 ENCOUNTER — Other Ambulatory Visit: Payer: Self-pay

## 2019-01-06 ENCOUNTER — Other Ambulatory Visit: Payer: Self-pay | Admitting: Family Medicine

## 2019-01-28 DIAGNOSIS — Z23 Encounter for immunization: Secondary | ICD-10-CM | POA: Diagnosis not present

## 2019-01-29 ENCOUNTER — Telehealth: Payer: Self-pay | Admitting: Family Medicine

## 2019-01-29 NOTE — Telephone Encounter (Signed)
Left message to return call 

## 2019-01-29 NOTE — Telephone Encounter (Signed)
Tell pt he just had a p e so we will not do one this time, due to the pandemic and our inability to guarantee our office is virus-free, we are encouraging folks to do reg ck up virtually. If glu numbers are stable I am not sending folks to the lab right now because of the high virus presnce in all cliinical settings. If glu good we are skipping A1cs unless patient insists

## 2019-01-29 NOTE — Telephone Encounter (Signed)
During prescreening call, patient asked if he needed to be fasting for his bloodwork. Said he usually has an A1c done when he comes in.  I noticed that patient had a Medicare wellness done in July of last year so it is too early for a physical.  Do we need to cancel the wellness and do an in office visit or make a virtual visit? Also does patient need to be fasting for any labs?

## 2019-01-29 NOTE — Telephone Encounter (Signed)
Pt last A1C was in July 2020. Pt has no pending lab work. Please advise. Thank you

## 2019-01-29 NOTE — Telephone Encounter (Signed)
[place] but I explained to wife that tomorrow will just be a phone visit and due to the pandemic we will hold off on doing labwork for now.  Wife wanted to let Dr. Richardson Landry know that she has noticed Daniel Mercado's memory slipping some.  She said he gets sensitive about it when she mentions that he is getting forgetful so she doesn't really want Dr. Richardson Landry to say that she told us that.  She just wanted to mention it incase there is a medication that can be prescribe.  Wanted to know if Dr. Richardson Landry would bring it up during the phone visit tomorrow.

## 2019-01-30 ENCOUNTER — Ambulatory Visit (INDEPENDENT_AMBULATORY_CARE_PROVIDER_SITE_OTHER): Payer: Medicare Other | Admitting: Family Medicine

## 2019-01-30 ENCOUNTER — Other Ambulatory Visit: Payer: Self-pay

## 2019-01-30 DIAGNOSIS — E038 Other specified hypothyroidism: Secondary | ICD-10-CM

## 2019-01-30 DIAGNOSIS — Z79899 Other long term (current) drug therapy: Secondary | ICD-10-CM | POA: Diagnosis not present

## 2019-01-30 DIAGNOSIS — I10 Essential (primary) hypertension: Secondary | ICD-10-CM | POA: Diagnosis not present

## 2019-01-30 DIAGNOSIS — E538 Deficiency of other specified B group vitamins: Secondary | ICD-10-CM

## 2019-01-30 DIAGNOSIS — E119 Type 2 diabetes mellitus without complications: Secondary | ICD-10-CM | POA: Diagnosis not present

## 2019-01-30 MED ORDER — LEVOTHYROXINE SODIUM 75 MCG PO TABS: 75.0000 ug | ORAL_TABLET | Freq: Every day | ORAL | 1 refills | Status: DC

## 2019-01-30 MED ORDER — ALLOPURINOL 100 MG PO TABS: 200.0000 mg | ORAL_TABLET | Freq: Every day | ORAL | 1 refills | Status: DC

## 2019-01-30 MED ORDER — GLIPIZIDE ER 2.5 MG PO TB24: ORAL_TABLET | ORAL | 1 refills | Status: DC

## 2019-01-30 MED ORDER — TAMSULOSIN HCL 0.4 MG PO CAPS: 0.8000 mg | ORAL_CAPSULE | Freq: Every day | ORAL | 1 refills | Status: DC

## 2019-01-30 NOTE — Progress Notes (Signed)
   Subjective:  [employer]iov[employer]  Patient ID: Daniel Mercado, male    DOB: [birth year], 86 y.o.   MRN: [id number]  Diabetes He presents for his follow-up diabetic visit. He has type 2 diabetes mellitus. There are no hypoglycemic associated symptoms. There are no diabetic associated symptoms. There are no hypoglycemic complications. There are no diabetic complications.  PT is taking Glipizide 2.5 mg daily. Sugars have been about 93-94 and the highest has been 110.   Virtual Visit via Telephone Note  I connected with Daniel Mercado on 01/30/19 at  9:30 AM EST by telephone and verified that I am speaking with the correct person using two identifiers.  Location: Patient: home Provider: office   I discussed the limitations, risks, security and privacy concerns of performing an evaluation and management service by telephone and the availability of in person appointments. I also discussed with the patient that there may be a patient responsible charge related to this service. The patient expressed understanding and agreed to proceed.   History of Present Illness:    Observations/Objective:   Assessment and Plan:   Follow Up Instructions:    I discussed the assessment and treatment plan with the patient. The patient was provided an opportunity to ask questions and all were answered. The patient agreed with the plan and demonstrated an understanding of the instructions.   The patient was advised to call back or seek an in-person evaluation if the symptoms worsen or if the condition fails to improve as anticipated.  I provided 25 minutes of non-face-to-face time during this encounter.  Patient claims compliance with diabetes medication. No obvious side effects. Reports no substantial low sugar spells. Most numbers are generally in good range when checked fasting. Generally does not miss a dose of medication. Watching diabetic diet closely  Patient has history of gout.  Compliant with medications.   No further attacks.  States handling well.  History of low thyroid.  Compliant with medications.  TSH 6 months ago was excellent  Ongoing challenges with prostate hypertrophy.  Flomax definitely helping.  Patient wishes to maintain  Patient notes more forgetfulness lately.  Getting maintenance.  No serious episodes yet such as getting lost or doing dangerous activities.  There is family history of dementia      Review of Systems No headache, no major weight loss or weight gain, no chest pain no back pain abdominal pain no change in bowel habits complete ROS otherwise negative     Objective:   Physical Exam Virtual       Assessment & Plan:  Impression 1 type 2 diabetes good control discussed maintain same meds  2.  Hypothyroidism.  Clinically stable.  Will reassess numbers in the spring prior to next visit  3.  Gout clinically stable to maintain same meds  4.  Increased forgetfulness.  Discussed at length.  In the spring we will see face-to-face.  We will do an MMSE then.  Also blood work 1 week before to further analyze this.  Of note the good news is he did get his first COVID-19 vaccine just yesterday

## 2019-02-20 ENCOUNTER — Encounter: Payer: Self-pay | Admitting: Family Medicine

## 2019-03-05 DIAGNOSIS — Z23 Encounter for immunization: Secondary | ICD-10-CM | POA: Diagnosis not present

## 2019-03-10 DIAGNOSIS — Z961 Presence of intraocular lens: Secondary | ICD-10-CM | POA: Diagnosis not present

## 2019-03-10 DIAGNOSIS — E119 Type 2 diabetes mellitus without complications: Secondary | ICD-10-CM | POA: Diagnosis not present

## 2019-04-03 ENCOUNTER — Telehealth: Payer: Self-pay | Admitting: Family Medicine

## 2019-04-03 DIAGNOSIS — Z79899 Other long term (current) drug therapy: Secondary | ICD-10-CM

## 2019-04-03 DIAGNOSIS — E119 Type 2 diabetes mellitus without complications: Secondary | ICD-10-CM

## 2019-04-03 DIAGNOSIS — E538 Deficiency of other specified B group vitamins: Secondary | ICD-10-CM

## 2019-04-03 DIAGNOSIS — I10 Essential (primary) hypertension: Secondary | ICD-10-CM

## 2019-04-03 NOTE — Telephone Encounter (Signed)
Apparently did not get jan b w, reorder all of that plus tsh  On pts schedule note to do mmse before the visit

## 2019-04-03 NOTE — Telephone Encounter (Signed)
Patient has follow up in May and was wondering if he needed labs.

## 2019-04-03 NOTE — Telephone Encounter (Signed)
Last labs 01/30/19 folate, b12, acr, a1c, bmp, liver, lipid

## 2019-04-04 NOTE — Telephone Encounter (Signed)
Labs orders placed and pt is aware. Lab orders mailed to patient. Pt verbalized understanding

## 2019-04-28 ENCOUNTER — Ambulatory Visit (INDEPENDENT_AMBULATORY_CARE_PROVIDER_SITE_OTHER): Payer: Medicare Other | Admitting: Family Medicine

## 2019-04-28 ENCOUNTER — Encounter: Payer: Self-pay | Admitting: Family Medicine

## 2019-04-28 ENCOUNTER — Other Ambulatory Visit: Payer: Self-pay

## 2019-04-28 VITALS — BP 130/82 | HR 92 | Temp 97.5°F | Ht 71.25 in | Wt 209.4 lb

## 2019-04-28 DIAGNOSIS — W57XXXA Bitten or stung by nonvenomous insect and other nonvenomous arthropods, initial encounter: Secondary | ICD-10-CM

## 2019-04-28 DIAGNOSIS — S20161A Insect bite (nonvenomous) of breast, right breast, initial encounter: Secondary | ICD-10-CM

## 2019-04-28 MED ORDER — DOXYCYCLINE HYCLATE 100 MG PO TABS: 200.0000 mg | ORAL_TABLET | Freq: Once | ORAL | 0 refills | Status: AC

## 2019-04-28 NOTE — Progress Notes (Signed)
   Subjective:    Patient ID: Daniel Mercado, male    DOB: 11-Aug-1931, 86 y.o.   MRN: [id number]  HPI  Patient arrives with tick bite to right side/chest area. The tick was removed yesterday and the area is red, swollen and itches.  Saw the tick attached to the rt breast area near nipple on the right.  Saw it yesterday morning.  Pulled it off, "thinks he got it all."  Was removed less than 36 hrs ago. Pt stating [place] when he thinks he got the tick.  Swelling and itching at bite site.  No body aches, fever, or fatigue. Has not used any medications for it.   Review of Systems  Constitutional: Negative for chills, fatigue and fever.  Gastrointestinal: Negative for diarrhea and vomiting.  Musculoskeletal: Negative for myalgias.  Skin: Positive for rash (insect bite).    Vitals:   04/28/19 0936  BP: 130/82  Pulse: 92  Temp: (!) 97.5 F (36.4 C)  SpO2: 97%       Objective:   Physical Exam Constitutional:      General: He is not in acute distress.    Appearance: Normal appearance. He is not ill-appearing or toxic-appearing.  Pulmonary:     Effort: Pulmonary effort is normal.     Breath sounds: Normal breath sounds.  Chest:     Chest wall: Swelling and tenderness present.    Abdominal:     Comments: +small 1 inch area near rt abdomen near umbilicus with mole and erythema. No drainage or abscess.  Musculoskeletal:        General: Normal range of motion.  Neurological:     General: No focal deficit present.     Mental Status: He is alert and oriented to person, place, and time.           Assessment & Plan:    1. Tick bite, initial encounter - doxycycline (VIBRA-TABS) 100 MG tablet; Take 2 tablets (200 mg total) by mouth once for 1 dose.  Dispense: 2 tablet; Refill: 0 -take 2 tab of doxycycline 100mg  once.  -use hydrocortisone and cool compress to the bite area.   Pt to rto or call if worsening symptoms, fever, body aches, or fatigue.  Pt in  agreement with plan.  F/u prn.

## 2019-04-28 NOTE — Patient Instructions (Addendum)
Sent your medication to pharmacy.  Take doxycyclin 2 tablets once today.  Use hydrocortisone cream on the bite area as needed.  Call or return if not improving or getting fever or body aches.      Tick Bite Information, Adult  Ticks are insects that can bite. Most ticks live in shrubs and grassy areas. They climb onto people and animals that go by. Then they bite. Some ticks carry germs that can make you sick. How can I prevent tick bites?  Use an insect repellent that has 20% or higher of the ingredients DEET, picaridin, or IR3535. Put this insect repellent on: ? Bare skin. ? The tops of your boots. ? Your pant legs. ? The ends of your sleeves.  If you use an insect repellent that has the ingredient permethrin, make sure to follow the instructions on the bottle. Treat the following: ? Clothing. ? Supplies. ? Boots. ? Tents.  Wear long sleeves, long pants, and light colors.  Tuck your pant legs into your socks.  Stay in the middle of the trail.  Try not to walk through long grass.  Before going inside your house, check your clothes, hair, and skin for ticks. Make sure to check your head, neck, armpits, waist, groin, and joint areas.  Check for ticks every day.  When you come indoors: ? Wash your clothes right away. ? Shower right away. ? Dry your clothes in a dryer on high heat for 60 minutes or more. What is the right way to remove a tick? Remove a tick from your skin as soon as possible.  To remove a tick that is crawling on your skin: ? Go outdoors and brush the tick off. ? Use tape or a lint roller.  To remove a tick that is biting: ? Wash your hands. ? If you have latex gloves, put them on. ? Use tweezers, curved forceps, or a tick-removal tool to grasp the tick. Grasp the tick as close to your skin and as close to the tick's head as possible. ? Gently pull up until the tick lets go.  Try to keep the tick's head attached to its body.  Do not twist or jerk  the tick.  Do not squeeze or crush the tick. Do not try to remove a tick with heat, alcohol, petroleum jelly, or fingernail polish. How should I get rid of a tick? Here are some ways to get rid of a tick that is alive:  Place the tick in rubbing alcohol.  Place the tick in a bag or container you can close tightly.  Wrap the tick tightly in tape.  Flush the tick down the toilet. Contact a doctor if:  You have symptoms of a disease, such as: ? Pain in a muscle, joint, or bone. ? Trouble walking or moving your legs. ? Numbness in your legs. ? Inability to move (paralysis). ? A red rash that makes a circle (bull's-eye rash). ? Redness and swelling where the tick bit you. ? A fever. ? Throwing up (vomiting) over and over. ? Diarrhea. ? Weight loss. ? Tender and swollen lymph glands. ? Shortness of breath. ? Cough. ? Belly pain (abdominal pain). ? Headache. ? Being more tired than normal. ? A change in how alert (conscious) you are. ? Confusion. Get help right away if:  You cannot remove a tick.  A part of a tick breaks off and gets stuck in your skin.  You are feeling worse. Summary  Ticks may  carry germs that can make you sick.  To prevent tick bites, wear long sleeves, long pants, and light colors. Use insect repellent. Follow the instructions on the bottle.  If the tick is biting, do not try to remove it with heat, alcohol, petroleum jelly, or fingernail polish.  Use tweezers, curved forceps, or a tick-removal tool to grasp the tick. Gently pull up until the tick lets go. Do not twist or jerk the tick. Do not squeeze or crush the tick.  If you have symptoms, contact a doctor. This information is not intended to replace advice given to you by your health care provider. Make sure you discuss any questions you have with your health care provider. Document Revised: 12/17/2017 Document Reviewed: 04/14/2016 Elsevier Patient Education  [place].

## 2019-05-27 DIAGNOSIS — Z79899 Other long term (current) drug therapy: Secondary | ICD-10-CM | POA: Diagnosis not present

## 2019-05-27 DIAGNOSIS — E119 Type 2 diabetes mellitus without complications: Secondary | ICD-10-CM | POA: Diagnosis not present

## 2019-05-27 DIAGNOSIS — I10 Essential (primary) hypertension: Secondary | ICD-10-CM | POA: Diagnosis not present

## 2019-05-27 DIAGNOSIS — E538 Deficiency of other specified B group vitamins: Secondary | ICD-10-CM | POA: Diagnosis not present

## 2019-05-28 LAB — HEPATIC FUNCTION PANEL
ALT: 16 IU/L (ref 0–44)
AST: 25 IU/L (ref 0–40)
Albumin: 4.3 g/dL (ref 3.6–4.6)
Alkaline Phosphatase: 64 IU/L (ref 39–117)
Bilirubin Total: 0.8 mg/dL (ref 0.0–1.2)
Bilirubin, Direct: 0.23 mg/dL (ref 0.00–0.40)
Total Protein: 6.8 g/dL (ref 6.0–8.5)

## 2019-05-28 LAB — BASIC METABOLIC PANEL
BUN/Creatinine Ratio: 11 (ref 10–24)
BUN: 14 mg/dL (ref 8–27)
CO2: 23 mmol/L (ref 20–29)
Calcium: 9.3 mg/dL (ref 8.6–10.2)
Chloride: 102 mmol/L (ref 96–106)
Creatinine, Ser: 1.33 mg/dL — ABNORMAL HIGH (ref 0.76–1.27)
GFR calc Af Amer: 56 mL/min/{1.73_m2} — ABNORMAL LOW (ref >59)
GFR calc non Af Amer: 48 mL/min/{1.73_m2} — ABNORMAL LOW (ref >59)
Glucose: 111 mg/dL — ABNORMAL HIGH (ref 65–99)
Potassium: 4.7 mmol/L (ref 3.5–5.2)
Sodium: 140 mmol/L (ref 134–144)

## 2019-05-28 LAB — MICROALBUMIN / CREATININE URINE RATIO
Creatinine, Urine: 114.8 mg/dL
Microalb/Creat Ratio: 3 mg/g creat (ref 0–29)
Microalbumin, Urine: 3.6 ug/mL

## 2019-05-28 LAB — LIPID PANEL
Chol/HDL Ratio: 3.6 ratio (ref 0.0–5.0)
Cholesterol, Total: 174 mg/dL (ref 100–199)
HDL: 49 mg/dL (ref >39)
LDL Chol Calc (NIH): 109 mg/dL — ABNORMAL HIGH (ref 0–99)
Triglycerides: 84 mg/dL (ref 0–149)
VLDL Cholesterol Cal: 16 mg/dL (ref 5–40)

## 2019-05-28 LAB — HEMOGLOBIN A1C
Est. average glucose Bld gHb Est-mCnc: 128 mg/dL
Hgb A1c MFr Bld: 6.1 % — ABNORMAL HIGH (ref 4.8–5.6)

## 2019-05-28 LAB — VITAMIN B12: Vitamin B-12: 221 pg/mL — ABNORMAL LOW (ref 232–1245)

## 2019-05-28 LAB — FOLATE: Folate: 6.5 ng/mL (ref >3.0)

## 2019-06-03 ENCOUNTER — Ambulatory Visit (INDEPENDENT_AMBULATORY_CARE_PROVIDER_SITE_OTHER): Payer: Medicare Other | Admitting: Family Medicine

## 2019-06-03 ENCOUNTER — Other Ambulatory Visit: Payer: Self-pay

## 2019-06-03 ENCOUNTER — Encounter: Payer: Self-pay | Admitting: Family Medicine

## 2019-06-03 VITALS — BP 138/84 | Temp 97.5°F | Ht 71.0 in | Wt 206.0 lb

## 2019-06-03 DIAGNOSIS — E119 Type 2 diabetes mellitus without complications: Secondary | ICD-10-CM

## 2019-06-03 DIAGNOSIS — I10 Essential (primary) hypertension: Secondary | ICD-10-CM

## 2019-06-03 DIAGNOSIS — Z1329 Encounter for screening for other suspected endocrine disorder: Secondary | ICD-10-CM

## 2019-06-03 DIAGNOSIS — R413 Other amnesia: Secondary | ICD-10-CM | POA: Diagnosis not present

## 2019-06-03 DIAGNOSIS — M1000 Idiopathic gout, unspecified site: Secondary | ICD-10-CM

## 2019-06-03 LAB — SPECIMEN STATUS REPORT

## 2019-06-03 MED ORDER — LEVOTHYROXINE SODIUM 75 MCG PO TABS: 75.0000 ug | ORAL_TABLET | Freq: Every day | ORAL | 1 refills | Status: DC

## 2019-06-03 MED ORDER — TAMSULOSIN HCL 0.4 MG PO CAPS: 0.8000 mg | ORAL_CAPSULE | Freq: Every day | ORAL | 1 refills | Status: DC

## 2019-06-03 MED ORDER — ALLOPURINOL 100 MG PO TABS: 200.0000 mg | ORAL_TABLET | Freq: Every day | ORAL | 1 refills | Status: DC

## 2019-06-03 MED ORDER — GLIPIZIDE ER 2.5 MG PO TB24: ORAL_TABLET | ORAL | 1 refills | Status: DC

## 2019-06-03 NOTE — Progress Notes (Signed)
Subjective:    Patient ID: Daniel Mercado, male    DOB: 19-Dec-1931, 86 y.o.   MRN: [id number]  Diabetes He presents for his follow-up diabetic visit. He has type 2 diabetes mellitus. He is compliant with treatment all of the time. Home blood sugar record trend: checks blood sugar once a week and has been good.   MMSE 25/30.   Results for orders placed or performed in visit on 04/03/19  Folate  Result Value Ref Range   Folate 6.5 >3.0 ng/mL  B12  Result Value Ref Range   Vitamin B-12 221 (L) 232 - 1,245 pg/mL  Urine Microalbumin w/creat. ratio  Result Value Ref Range   Creatinine, Urine 114.8 Not Estab. mg/dL   Microalbumin, Urine 3.6 Not Estab. ug/mL   Microalb/Creat Ratio 3 0 - 29 mg/g creat  Hemoglobin A1c  Result Value Ref Range   Hgb A1c MFr Bld 6.1 (H) 4.8 - 5.6 %   Est. average glucose Bld gHb Est-mCnc 128 mg/dL  Basic Metabolic Panel (BMET)  Result Value Ref Range   Glucose 111 (H) 65 - 99 mg/dL   BUN 14 8 - 27 mg/dL   Creatinine, Ser 1.33 (H) 0.76 - 1.27 mg/dL   GFR calc non Af Amer 48 (L) >59 mL/min/1.73   GFR calc Af Amer 56 (L) >59 mL/min/1.73   BUN/Creatinine Ratio 11 10 - 24   Sodium 140 134 - 144 mmol/L   Potassium 4.7 3.5 - 5.2 mmol/L   Chloride 102 96 - 106 mmol/L   CO2 23 20 - 29 mmol/L   Calcium 9.3 8.6 - 10.2 mg/dL  Hepatic function panel  Result Value Ref Range   Total Protein 6.8 6.0 - 8.5 g/dL   Albumin 4.3 3.6 - 4.6 g/dL   Bilirubin Total 0.8 0.0 - 1.2 mg/dL   Bilirubin, Direct 0.23 0.00 - 0.40 mg/dL   Alkaline Phosphatase 64 39 - 117 IU/L   AST 25 0 - 40 IU/L   ALT 16 0 - 44 IU/L  Lipid Profile  Result Value Ref Range   Cholesterol, Total 174 100 - 199 mg/dL   Triglycerides 84 0 - 149 mg/dL   HDL 49 >39 mg/dL   VLDL Cholesterol Cal 16 5 - 40 mg/dL   LDL Chol Calc (NIH) 109 (H) 0 - 99 mg/dL   Chol/HDL Ratio 3.6 0.0 - 5.0 ratio   Blood pressure medicine and blood pressure levels reviewed today with patient. Compliant with blood pressure  medicine. States does not miss a dose. No obvious side effects. Blood pressure generally good when checked elsewhere. Watching salt intake.   Patient claims compliance with diabetes medication. No obvious side effects. Reports no substantial low sugar spells. Most numbers are generally in good range when checked fasting. Generally does not miss a dose of medication. Watching diabetic diet closely  Patient continues to experience an element of mild cognitive deficit.  Notes his forgetfulness.  MMSE today 25 out of 30.  This correlates with a similar MMSE to 1 year ago.  States does not miss medication trying to stay physically active. Review of Systems No headache, no major weight loss or weight gain, no chest pain no back pain abdominal pain no change in bowel habits complete ROS otherwise negative     Objective:   Physical Exam Alert and oriented, vitals reviewed and stable, NAD ENT-TM's and ext canals WNL bilat via otoscopic exam Soft palate, tonsils and post pharynx WNL via oropharyngeal exam Neck-symmetric, no  masses; thyroid nonpalpable and nontender Pulmonary-no tachypnea or accessory muscle use; Clear without wheezes via auscultation Card--no abnrml murmurs, rhythm reg and rate WNL Carotid pulses symmetric, without bruits        Assessment & Plan:  Impression 1 type 2 diabetes.  Control discussed overall good control compliance discussed  2.  Hypertension.  Blood pressure excellent to maintain same meds dietary measures regard discussed  3.  Hypothyroidism.  Will contact lab for TSH  4.  Prostate hypertrophy medication still working to maintain Flomax  5.  Gout clinically stable to maintain same  Follow-up in 6 months.  Diet exercise discussed medications refilled

## 2019-06-05 LAB — SPECIMEN STATUS REPORT

## 2019-06-05 LAB — TSH: TSH: 4.22 u[IU]/mL (ref 0.450–4.500)

## 2019-11-10 DIAGNOSIS — Z23 Encounter for immunization: Secondary | ICD-10-CM | POA: Diagnosis not present

## 2019-11-20 ENCOUNTER — Ambulatory Visit (INDEPENDENT_AMBULATORY_CARE_PROVIDER_SITE_OTHER): Payer: Medicare Other | Admitting: Family Medicine

## 2019-11-20 ENCOUNTER — Encounter: Payer: Self-pay | Admitting: Family Medicine

## 2019-11-20 ENCOUNTER — Other Ambulatory Visit: Payer: Self-pay

## 2019-11-20 VITALS — BP 138/80 | HR 88 | Temp 97.8°F | Wt 202.0 lb

## 2019-11-20 DIAGNOSIS — H3500 Unspecified background retinopathy: Secondary | ICD-10-CM | POA: Diagnosis not present

## 2019-11-20 DIAGNOSIS — H9190 Unspecified hearing loss, unspecified ear: Secondary | ICD-10-CM | POA: Diagnosis not present

## 2019-11-20 DIAGNOSIS — E119 Type 2 diabetes mellitus without complications: Secondary | ICD-10-CM | POA: Diagnosis not present

## 2019-11-20 DIAGNOSIS — D367 Benign neoplasm of other specified sites: Secondary | ICD-10-CM | POA: Diagnosis not present

## 2019-11-20 DIAGNOSIS — Z23 Encounter for immunization: Secondary | ICD-10-CM

## 2019-11-20 DIAGNOSIS — Z974 Presence of external hearing-aid: Secondary | ICD-10-CM | POA: Diagnosis not present

## 2019-11-20 DIAGNOSIS — E038 Other specified hypothyroidism: Secondary | ICD-10-CM

## 2019-11-20 MED ORDER — GLIPIZIDE ER 2.5 MG PO TB24: ORAL_TABLET | ORAL | 1 refills | Status: DC

## 2019-11-20 MED ORDER — LEVOTHYROXINE SODIUM 75 MCG PO TABS: 75.0000 ug | ORAL_TABLET | Freq: Every day | ORAL | 1 refills | Status: DC

## 2019-11-20 MED ORDER — ALLOPURINOL 100 MG PO TABS
200.0000 mg | ORAL_TABLET | Freq: Every day | ORAL | 1 refills | Status: DC
Start: 2019-11-20 — End: 2020-04-15

## 2019-11-20 MED ORDER — TAMSULOSIN HCL 0.4 MG PO CAPS
0.8000 mg | ORAL_CAPSULE | Freq: Every day | ORAL | 1 refills | Status: DC
Start: 2019-11-20 — End: 2020-02-23

## 2019-11-20 NOTE — Progress Notes (Signed)
Patient ID: Daniel Mercado, male    DOB: July 15, 1931, 86 y.o.   MRN: [id number]   Chief Complaint  Patient presents with  . Hypertension  . Hypothyroidism  . Diabetes   Subjective:    HPI  Cc-f/u htn, hypothyroid, and DM2.  HTN Pt compliant with BP meds.  No SEs Denies chest pain, sob, LE swelling, or blurry vision.   Dm2- Compliant with medications. Checking blood glucose.   Not seeing any high or low numbers.  Denies polyuria or polydipsia.  Eye exam: ordered, overdue Foot exam: no new concerns, sores or ulcers.  Hypothyroid- doing well. No constipation, diarrhea, palpations, sweating, or hair loss. Compliant with meds.  Small lump on side of left leg on lateral shin, 1 inch non tender, skin colored, just below surface of skin. No erythema.  Noticed it about 3 months ago.  Pt still driving.  Pt very hard of hearing, not wearing hearing aids.  Medical History Daniel Mercado has a past medical history of Erectile dysfunction, Gout, History of colon polyps, Left ureteral stone, Nocturia, Peripheral neuropathy, Sciatica, and Type 2 diabetes mellitus ([place]).   Outpatient Encounter Medications as of 11/20/2019  Medication Sig  . allopurinol (ZYLOPRIM) 100 MG tablet Take 2 tablets (200 mg total) by mouth daily.  Marland Kitchen aspirin 81 MG tablet Take 81 mg by mouth daily.  Marland Kitchen glipiZIDE (GLUCOTROL XL) 2.5 MG 24 hr tablet Take 1 tablet by mouth once daily with breakfast  . levothyroxine (SYNTHROID) 75 MCG tablet Take 1 tablet (75 mcg total) by mouth daily.  . polyethylene glycol powder (MIRALAX) powder Take 17 g by mouth daily as needed for mild constipation or moderate constipation.   . tamsulosin (FLOMAX) 0.4 MG CAPS capsule Take 2 capsules (0.8 mg total) by mouth daily.  . [DISCONTINUED] allopurinol (ZYLOPRIM) 100 MG tablet Take 2 tablets (200 mg total) by mouth daily.  . [DISCONTINUED] glipiZIDE (GLUCOTROL XL) 2.5 MG 24 hr tablet Take 1 tablet by mouth once daily with breakfast  .  [DISCONTINUED] levothyroxine (SYNTHROID) 75 MCG tablet Take 1 tablet (75 mcg total) by mouth daily.  . [DISCONTINUED] tamsulosin (FLOMAX) 0.4 MG CAPS capsule Take 2 capsules (0.8 mg total) by mouth daily.   No facility-administered encounter medications on file as of 11/20/2019.     Review of Systems  Constitutional: Negative for chills and fever.  HENT: Negative for congestion, rhinorrhea and sore throat.   Respiratory: Negative for cough, shortness of breath and wheezing.   Cardiovascular: Negative for chest pain and leg swelling.  Gastrointestinal: Negative for abdominal pain, diarrhea, nausea and vomiting.  Genitourinary: Negative for dysuria and frequency.  Skin: Negative for rash.  Neurological: Negative for dizziness, weakness and headaches.     Vitals BP 138/80   Pulse 88   Temp 97.8 F (36.6 C)   Wt 202 lb (91.6 kg)   SpO2 96%   BMI 28.17 kg/m   Objective:   Physical Exam Vitals and nursing note reviewed.  Constitutional:      General: He is not in acute distress.    Appearance: Normal appearance. He is not ill-appearing.  HENT:     Head: Normocephalic.     Ears:     Comments: Hard of hearing.    Nose: Nose normal. No congestion.     Mouth/Throat:     Mouth: Mucous membranes are moist.     Pharynx: No oropharyngeal exudate.  Eyes:     Extraocular Movements: Extraocular movements intact.  Conjunctiva/sclera: Conjunctivae normal.     Pupils: Pupils are equal, round, and reactive to light.  Cardiovascular:     Rate and Rhythm: Normal rate and regular rhythm.     Pulses: Normal pulses.     Heart sounds: Normal heart sounds. No murmur heard.   Pulmonary:     Effort: Pulmonary effort is normal. No respiratory distress.     Breath sounds: Normal breath sounds. No wheezing, rhonchi or rales.  Musculoskeletal:        General: Normal range of motion.     Right lower leg: No edema.     Left lower leg: No edema.     Comments: +1 cm nodule just under skin,  skin colored, non tender, no erythema, warmth, or fluctuance- on left lateral shin.  Skin:    General: Skin is warm and dry.     Findings: No rash.  Neurological:     General: No focal deficit present.     Mental Status: He is alert and oriented to person, place, and time.     Cranial Nerves: No cranial nerve deficit.  Psychiatric:        Mood and Affect: Mood normal.        Behavior: Behavior normal.        Thought Content: Thought content normal.        Judgment: Judgment normal.      Assessment and Plan   1. Diabetes mellitus without complication (HCC) - CBC - CMP14+EGFR - Hemoglobin A1c - Lipid panel - glipiZIDE (GLUCOTROL XL) 2.5 MG 24 hr tablet; Take 1 tablet by mouth once daily with breakfast  Dispense: 90 tablet; Refill: 1  2. Need for vaccination - Flu Vaccine QUAD High Dose(Fluad)  3. Does use hearing aid  4. Other specified hypothyroidism - TSH - levothyroxine (SYNTHROID) 75 MCG tablet; Take 1 tablet (75 mcg total) by mouth daily.  Dispense: 90 tablet; Refill: 1  5. Retinopathy - Ambulatory referral to Ophthalmology  6. Cyst, dermoid, leg, left - Ambulatory referral to Dermatology  7. Hearing loss, unspecified hearing loss type, unspecified laterality   htn- suboptimal.  Pt to dec salt in diet and cont to monitor.  Not currently on a medication.   Hypothyroidism- stable. Cont meds.  Labs ordered today.  DM2- stable, labs ordered, cont glipizide and cont to monitor for hypoglycemia. Referral to eye doctor.  Left leg cyst- referral derm.  pt to get labs today.  F/u [address] prn.

## 2019-11-21 LAB — CBC
Hematocrit: 46.9 % (ref 37.5–51.0)
Hemoglobin: 15.8 g/dL (ref 13.0–17.7)
MCH: 31.4 pg (ref 26.6–33.0)
MCHC: 33.7 g/dL (ref 31.5–35.7)
MCV: 93 fL (ref 79–97)
Platelets: 190 10*3/uL (ref 150–450)
RBC: 5.03 x10E6/uL (ref 4.14–5.80)
RDW: 13.3 % (ref 11.6–15.4)
WBC: 6.5 10*3/uL (ref 3.4–10.8)

## 2019-11-21 LAB — CMP14+EGFR
ALT: 13 IU/L (ref 0–44)
AST: 19 IU/L (ref 0–40)
Albumin/Globulin Ratio: 1.5 (ref 1.2–2.2)
Albumin: 4.2 g/dL (ref 3.6–4.6)
Alkaline Phosphatase: 68 IU/L (ref 44–121)
BUN/Creatinine Ratio: 13 (ref 10–24)
BUN: 16 mg/dL (ref 8–27)
Bilirubin Total: 0.6 mg/dL (ref 0.0–1.2)
CO2: 25 mmol/L (ref 20–29)
Calcium: 9.9 mg/dL (ref 8.6–10.2)
Chloride: 103 mmol/L (ref 96–106)
Creatinine, Ser: 1.27 mg/dL (ref 0.76–1.27)
GFR calc Af Amer: 59 mL/min/{1.73_m2} — ABNORMAL LOW (ref >59)
GFR calc non Af Amer: 51 mL/min/{1.73_m2} — ABNORMAL LOW (ref >59)
Globulin, Total: 2.8 g/dL (ref 1.5–4.5)
Glucose: 106 mg/dL — ABNORMAL HIGH (ref 65–99)
Potassium: 5.1 mmol/L (ref 3.5–5.2)
Sodium: 141 mmol/L (ref 134–144)
Total Protein: 7 g/dL (ref 6.0–8.5)

## 2019-11-21 LAB — LIPID PANEL
Chol/HDL Ratio: 4.1 ratio (ref 0.0–5.0)
Cholesterol, Total: 186 mg/dL (ref 100–199)
HDL: 45 mg/dL (ref >39)
LDL Chol Calc (NIH): 125 mg/dL — ABNORMAL HIGH (ref 0–99)
Triglycerides: 88 mg/dL (ref 0–149)
VLDL Cholesterol Cal: 16 mg/dL (ref 5–40)

## 2019-11-21 LAB — HEMOGLOBIN A1C
Est. average glucose Bld gHb Est-mCnc: 131 mg/dL
Hgb A1c MFr Bld: 6.2 % — ABNORMAL HIGH (ref 4.8–5.6)

## 2019-11-21 LAB — TSH: TSH: 3.04 u[IU]/mL (ref 0.450–4.500)

## 2020-02-23 ENCOUNTER — Other Ambulatory Visit: Payer: Self-pay

## 2020-02-23 MED ORDER — TAMSULOSIN HCL 0.4 MG PO CAPS
0.8000 mg | ORAL_CAPSULE | Freq: Every day | ORAL | 1 refills | Status: DC
Start: 2020-02-23 — End: 2020-08-27

## 2020-03-09 ENCOUNTER — Encounter: Payer: Self-pay | Admitting: Family Medicine

## 2020-03-09 DIAGNOSIS — Z961 Presence of intraocular lens: Secondary | ICD-10-CM | POA: Diagnosis not present

## 2020-03-09 DIAGNOSIS — E119 Type 2 diabetes mellitus without complications: Secondary | ICD-10-CM | POA: Diagnosis not present

## 2020-03-09 LAB — HM DIABETES EYE EXAM

## 2020-03-29 ENCOUNTER — Telehealth: Payer: Self-pay | Admitting: Family Medicine

## 2020-03-29 DIAGNOSIS — E119 Type 2 diabetes mellitus without complications: Secondary | ICD-10-CM

## 2020-03-29 MED ORDER — GLIPIZIDE ER 2.5 MG PO TB24: ORAL_TABLET | ORAL | 0 refills | Status: DC

## 2020-03-29 NOTE — Telephone Encounter (Signed)
Pls send in 30 day supply till appt, no refill thx. D.r taylor

## 2020-03-29 NOTE — Telephone Encounter (Signed)
Please advise. Thank you

## 2020-03-29 NOTE — Telephone Encounter (Signed)
Patient is needing refill on on glipzide 2.5 mg ,he states has one pill left and has medication follow up on 3/31 and needing his A1C checked also. [facility]

## 2020-03-29 NOTE — Telephone Encounter (Signed)
Refill sent in and pt is aware 

## 2020-04-15 ENCOUNTER — Other Ambulatory Visit: Payer: Self-pay

## 2020-04-15 ENCOUNTER — Encounter: Payer: Self-pay | Admitting: Family Medicine

## 2020-04-15 ENCOUNTER — Ambulatory Visit (INDEPENDENT_AMBULATORY_CARE_PROVIDER_SITE_OTHER): Payer: Medicare Other | Admitting: Family Medicine

## 2020-04-15 ENCOUNTER — Other Ambulatory Visit: Payer: Self-pay | Admitting: Family Medicine

## 2020-04-15 VITALS — BP 140/88 | HR 68 | Temp 97.0°F | Ht 71.0 in | Wt 211.0 lb

## 2020-04-15 DIAGNOSIS — E119 Type 2 diabetes mellitus without complications: Secondary | ICD-10-CM | POA: Diagnosis not present

## 2020-04-15 DIAGNOSIS — E0842 Diabetes mellitus due to underlying condition with diabetic polyneuropathy: Secondary | ICD-10-CM | POA: Diagnosis not present

## 2020-04-15 DIAGNOSIS — I10 Essential (primary) hypertension: Secondary | ICD-10-CM

## 2020-04-15 DIAGNOSIS — E038 Other specified hypothyroidism: Secondary | ICD-10-CM | POA: Diagnosis not present

## 2020-04-15 MED ORDER — ALLOPURINOL 100 MG PO TABS
100.0000 mg | ORAL_TABLET | Freq: Every day | ORAL | 1 refills | Status: DC
Start: 2020-04-15 — End: 2021-01-04

## 2020-04-15 MED ORDER — LISINOPRIL 5 MG PO TABS: 5.0000 mg | ORAL_TABLET | Freq: Every day | ORAL | 1 refills | Status: DC

## 2020-04-15 NOTE — Patient Instructions (Signed)
Start new medication lisinopril.

## 2020-04-15 NOTE — Telephone Encounter (Signed)
Lab Results  Component Value Date   HGBA1C 6.2 (H) 11/20/2019    Lab Results  Component Value Date   CREATININE 1.27 11/20/2019     Lab Results  Component Value Date   CHOL 186 11/20/2019   HDL 45 11/20/2019   LDLCALC 125 (H) 11/20/2019   TRIG 88 11/20/2019   CHOLHDL 4.1 11/20/2019     BP Readings from Last 3 Encounters:  04/15/20 140/88  11/20/19 138/80  06/03/19 138/84

## 2020-04-15 NOTE — Telephone Encounter (Signed)
Pls call pt, was supposed to get labs today, didn't get them. Needs these before more refills of meds. Dr. Lovena Le

## 2020-04-15 NOTE — Progress Notes (Signed)
Patient ID: Daniel Mercado, male    DOB: Aug 28, 1931, 87 y.o.   MRN: [id number]   Chief Complaint  Patient presents with  . Diabetes   Subjective:    HPI  F/u dm2 med check up.   Dm2- Compliant with medications. Checking blood glucose.   Not seeing any high or low numbers.  Denies polyuria or polydipsia.  Eye exam: ordered last visit, overdue. Foot exam: no concerns.  Diabetes- Pt states sugar has been "running good."  Gout- Pt states he has only taken one allopurinol for the past 3 years and has done fine with gout. Would like rx to be changed from 2 a day to one a day.   Seeing 90s in am for bg.   Hypothyroid- stable. Doing well.  No side effects.  Medical History Daniel Mercado has a past medical history of Erectile dysfunction, Gout, History of colon polyps, Left ureteral stone, Nocturia, Peripheral neuropathy, Sciatica, and Type 2 diabetes mellitus (Daniel Mercado).   Outpatient Encounter Medications as of 04/15/2020  Medication Sig  . aspirin 81 MG tablet Take 81 mg by mouth daily.  Marland Kitchen glipiZIDE (GLUCOTROL XL) 2.5 MG 24 hr tablet Take 1 tablet by mouth once daily with breakfast  . levothyroxine (SYNTHROID) 75 MCG tablet Take 1 tablet (75 mcg total) by mouth daily.  Marland Kitchen lisinopril (ZESTRIL) 5 MG tablet Take 1 tablet (5 mg total) by mouth daily.  . polyethylene glycol powder (GLYCOLAX/MIRALAX) 17 GM/SCOOP powder Take 17 g by mouth daily as needed for mild constipation or moderate constipation.   . tamsulosin (FLOMAX) 0.4 MG CAPS capsule Take 2 capsules (0.8 mg total) by mouth daily.  . [DISCONTINUED] allopurinol (ZYLOPRIM) 100 MG tablet Take 2 tablets (200 mg total) by mouth daily.  Marland Kitchen allopurinol (ZYLOPRIM) 100 MG tablet Take 1 tablet (100 mg total) by mouth daily.   No facility-administered encounter medications on file as of 04/15/2020.     Review of Systems  Constitutional: Negative for chills and fever.  HENT: Negative for congestion, rhinorrhea and sore throat.   Respiratory:  Negative for cough, shortness of breath and wheezing.   Cardiovascular: Negative for chest pain and leg swelling.  Gastrointestinal: Negative for abdominal pain, diarrhea, nausea and vomiting.  Genitourinary: Negative for dysuria and frequency.  Skin: Negative for rash.  Neurological: Negative for dizziness, weakness and headaches.     Vitals BP 140/88   Pulse 68   Temp (!) 97 F (36.1 C)   Ht '5\' 11"'  (1.803 m)   Wt 211 lb (95.7 kg)   SpO2 95%   BMI 29.43 kg/m   Objective:   Physical Exam Vitals and nursing note reviewed.  Constitutional:      General: He is not in acute distress.    Appearance: Normal appearance. He is not ill-appearing.  HENT:     Head: Normocephalic.     Nose: Nose normal. No congestion.     Mouth/Throat:     Mouth: Mucous membranes are moist.     Pharynx: No oropharyngeal exudate.  Eyes:     Extraocular Movements: Extraocular movements intact.     Conjunctiva/sclera: Conjunctivae normal.     Pupils: Pupils are equal, round, and reactive to light.  Cardiovascular:     Rate and Rhythm: Normal rate and regular rhythm.     Pulses: Normal pulses.     Heart sounds: Normal heart sounds. No murmur heard.   Pulmonary:     Effort: Pulmonary effort is normal.     Breath  sounds: Normal breath sounds. No wheezing, rhonchi or rales.  Musculoskeletal:        General: Normal range of motion.     Right lower leg: No edema.     Left lower leg: No edema.  Skin:    General: Skin is warm and dry.     Findings: No rash.  Neurological:     General: No focal deficit present.     Mental Status: He is alert and oriented to person, place, and time.     Cranial Nerves: No cranial nerve deficit.  Psychiatric:        Mood and Affect: Mood normal.        Behavior: Behavior normal.        Thought Content: Thought content normal.        Judgment: Judgment normal.      Assessment and Plan   1. Diabetic polyneuropathy associated with diabetes mellitus due to  underlying condition (HCC) - CMP14+EGFR - Hemoglobin A1c - Microalbumin, urine  2. Essential hypertension, benign - CBC - Lipid panel  3. Other specified hypothyroidism - TSH   dm2- labs ordered. Pt on glipizide, need to make sure not having any lows.  H/o htn in past- not currently on any medications. Elevated blood pressure- today 150/84, on recheck- 140/88.  Will start lisinopril 68m.  Pt to get 90 day supply of meds after seeing labs. Pt needing refills on glipizide.  BP Readings from Last 3 Encounters:  04/15/20 140/88  11/20/19 138/80  06/03/19 138/84   Hypothyroidism- stable. Ordered labs. Cont meds.  Return in about 6 months (around 10/15/2020) for f/u htn, dm2.  04/15/2020

## 2020-04-16 LAB — LIPID PANEL
Chol/HDL Ratio: 3.7 ratio (ref 0.0–5.0)
Cholesterol, Total: 163 mg/dL (ref 100–199)
HDL: 44 mg/dL (ref >39)
LDL Chol Calc (NIH): 103 mg/dL — ABNORMAL HIGH (ref 0–99)
Triglycerides: 85 mg/dL (ref 0–149)
VLDL Cholesterol Cal: 16 mg/dL (ref 5–40)

## 2020-04-16 LAB — CBC
Hematocrit: 44.6 % (ref 37.5–51.0)
Hemoglobin: 15.7 g/dL (ref 13.0–17.7)
MCH: 31.9 pg (ref 26.6–33.0)
MCHC: 35.2 g/dL (ref 31.5–35.7)
MCV: 91 fL (ref 79–97)
Platelets: 175 10*3/uL (ref 150–450)
RBC: 4.92 x10E6/uL (ref 4.14–5.80)
RDW: 12.9 % (ref 11.6–15.4)
WBC: 5.5 10*3/uL (ref 3.4–10.8)

## 2020-04-16 LAB — CMP14+EGFR
ALT: 12 IU/L (ref 0–44)
AST: 25 IU/L (ref 0–40)
Albumin/Globulin Ratio: 1.7 (ref 1.2–2.2)
Albumin: 4.3 g/dL (ref 3.6–4.6)
Alkaline Phosphatase: 61 IU/L (ref 44–121)
BUN/Creatinine Ratio: 12 (ref 10–24)
BUN: 16 mg/dL (ref 8–27)
Bilirubin Total: 0.6 mg/dL (ref 0.0–1.2)
CO2: 24 mmol/L (ref 20–29)
Calcium: 10 mg/dL (ref 8.6–10.2)
Chloride: 102 mmol/L (ref 96–106)
Creatinine, Ser: 1.34 mg/dL — ABNORMAL HIGH (ref 0.76–1.27)
Globulin, Total: 2.6 g/dL (ref 1.5–4.5)
Glucose: 106 mg/dL — ABNORMAL HIGH (ref 65–99)
Potassium: 4.8 mmol/L (ref 3.5–5.2)
Sodium: 141 mmol/L (ref 134–144)
Total Protein: 6.9 g/dL (ref 6.0–8.5)
eGFR: 52 mL/min/{1.73_m2} — ABNORMAL LOW (ref >59)

## 2020-04-16 LAB — HEMOGLOBIN A1C
Est. average glucose Bld gHb Est-mCnc: 128 mg/dL
Hgb A1c MFr Bld: 6.1 % — ABNORMAL HIGH (ref 4.8–5.6)

## 2020-04-16 LAB — TSH: TSH: 4.44 u[IU]/mL (ref 0.450–4.500)

## 2020-04-16 LAB — MICROALBUMIN, URINE: Microalbumin, Urine: 4.2 ug/mL

## 2020-05-24 ENCOUNTER — Other Ambulatory Visit: Payer: Self-pay | Admitting: Family Medicine

## 2020-05-24 ENCOUNTER — Telehealth: Payer: Self-pay | Admitting: Family Medicine

## 2020-05-24 DIAGNOSIS — E119 Type 2 diabetes mellitus without complications: Secondary | ICD-10-CM

## 2020-05-24 NOTE — Telephone Encounter (Signed)
Patient is requesting 90 day supply of his glipizide 2.5 called into [facility] its cheaper than 30 days. He had appointment on 04/15/20 for medication follow up. [facility]

## 2020-05-28 NOTE — Telephone Encounter (Signed)
[facility] supply [facility] on [facility]. Left message to return call to notify pt.

## 2020-05-31 NOTE — Telephone Encounter (Signed)
Pt.notified

## 2020-07-08 DIAGNOSIS — Z23 Encounter for immunization: Secondary | ICD-10-CM | POA: Diagnosis not present

## 2020-07-20 ENCOUNTER — Other Ambulatory Visit: Payer: Self-pay | Admitting: *Deleted

## 2020-07-20 ENCOUNTER — Telehealth: Payer: Medicare Other | Admitting: Family Medicine

## 2020-07-20 ENCOUNTER — Other Ambulatory Visit: Payer: Self-pay | Admitting: Family Medicine

## 2020-07-20 DIAGNOSIS — E038 Other specified hypothyroidism: Secondary | ICD-10-CM

## 2020-07-20 MED ORDER — LEVOTHYROXINE SODIUM 75 MCG PO TABS: 75.0000 ug | ORAL_TABLET | Freq: Every day | ORAL | 0 refills | Status: DC

## 2020-07-20 NOTE — Telephone Encounter (Signed)
Refill sent per protocol and pt was notified

## 2020-07-20 NOTE — Telephone Encounter (Signed)
Pt is needing a medication refill for levothyroxine (SYNTHROID) 75 MCG tablet  [place], [address]

## 2020-07-20 NOTE — Telephone Encounter (Signed)
Duplicate,

## 2020-08-27 ENCOUNTER — Encounter: Payer: Self-pay | Admitting: Internal Medicine

## 2020-08-27 ENCOUNTER — Ambulatory Visit (INDEPENDENT_AMBULATORY_CARE_PROVIDER_SITE_OTHER): Payer: Medicare Other | Admitting: Internal Medicine

## 2020-08-27 ENCOUNTER — Other Ambulatory Visit: Payer: Self-pay

## 2020-08-27 VITALS — BP 138/79 | HR 71 | Temp 97.9°F | Resp 18 | Ht 72.0 in | Wt 209.0 lb

## 2020-08-27 DIAGNOSIS — K5904 Chronic idiopathic constipation: Secondary | ICD-10-CM | POA: Diagnosis not present

## 2020-08-27 DIAGNOSIS — M1000 Idiopathic gout, unspecified site: Secondary | ICD-10-CM | POA: Diagnosis not present

## 2020-08-27 DIAGNOSIS — E119 Type 2 diabetes mellitus without complications: Secondary | ICD-10-CM

## 2020-08-27 DIAGNOSIS — I10 Essential (primary) hypertension: Secondary | ICD-10-CM

## 2020-08-27 DIAGNOSIS — Z7689 Persons encountering health services in other specified circumstances: Secondary | ICD-10-CM

## 2020-08-27 DIAGNOSIS — H9190 Unspecified hearing loss, unspecified ear: Secondary | ICD-10-CM

## 2020-08-27 DIAGNOSIS — N400 Enlarged prostate without lower urinary tract symptoms: Secondary | ICD-10-CM | POA: Diagnosis not present

## 2020-08-27 DIAGNOSIS — E038 Other specified hypothyroidism: Secondary | ICD-10-CM | POA: Diagnosis not present

## 2020-08-27 MED ORDER — GLIPIZIDE ER 2.5 MG PO TB24: 2.5000 mg | ORAL_TABLET | Freq: Every day | ORAL | 1 refills | Status: DC

## 2020-08-27 MED ORDER — TAMSULOSIN HCL 0.4 MG PO CAPS: 0.8000 mg | ORAL_CAPSULE | Freq: Every day | ORAL | 1 refills | Status: DC

## 2020-08-27 MED ORDER — DOCUSATE SODIUM 100 MG PO CAPS: 100.0000 mg | ORAL_CAPSULE | Freq: Every day | ORAL | 5 refills | Status: AC | PRN

## 2020-08-27 NOTE — Assessment & Plan Note (Signed)
Uses hearing aide

## 2020-08-27 NOTE — Assessment & Plan Note (Signed)
On Allopurinol No recent gout flares Check uric acid level

## 2020-08-27 NOTE — Progress Notes (Signed)
New Patient Office Visit  Subjective:  Patient ID: Daniel Mercado, male    DOB: 16-Jun-1931  Age: 87 y.o. MRN: [id number]  CC:  Chief Complaint  Patient presents with   New Patient (Initial Visit)    New patient was seeing dr Wolfgang [place] pt needs refills on his meds    HPI [name] is a 87 year old male with PMH of DM, BPH, DDD of lumbar spine s/p disc repair and gout who presents for establishing care. He is a former patient of Dr Lovena Le.  BP is well-controlled. Takes medications regularly. Patient denies headache, dizziness, chest pain, dyspnea or palpitations.  His HbA1C was 6.1. He takes Glipizide. Denies any polyuria or polyphagia. Does not recall trying any other medication for it.  He takes Allopurinol for h/o gout. No recent gout flares.  He takes Tamsulosin for BPH.  He has had COVID vaccine, Pneumococcal and TDaP vaccine.  Past Medical History:  Diagnosis Date   Erectile dysfunction    Gout    History of colon polyps    Left ureteral stone    Nocturia    Peripheral neuropathy    Sciatica    Type 2 diabetes mellitus ([place])     Past Surgical History:  Procedure Laterality Date   COLONOSCOPY  last one 2009   [place]    Family History  Problem Relation Age of Onset   Diabetes Other     Social History   Socioeconomic History   Marital status: Married    Spouse name: Not on file   Number of children: Not on file   Years of education: Not on file   Highest education level: Not on file  Occupational History   Not on file  Tobacco Use   Smoking status: Former   Smokeless tobacco: Current    Types: Chew  Substance and Sexual Activity   Alcohol use: No   Drug use: No   Sexual activity: Not on file  Other Topics Concern   Not on file  Social History Narrative   Not on file   Social Determinants of Health   Financial Resource Strain: Not on file  Food Insecurity: Not on file  Transportation Needs: Not on file  Physical  Activity: Not on file  Stress: Not on file  Social Connections: Not on file  Intimate Partner Violence: Not on file    ROS Review of Systems  Constitutional:  Negative for chills and fever.  HENT:  Negative for congestion and sore throat.   Eyes:  Negative for pain and discharge.  Respiratory:  Negative for cough and shortness of breath.   Cardiovascular:  Negative for chest pain and palpitations.  Gastrointestinal:  Negative for constipation, diarrhea, nausea and vomiting.  Endocrine: Negative for polydipsia and polyuria.  Genitourinary:  Negative for dysuria and hematuria.  Musculoskeletal:  Negative for neck pain and neck stiffness.  Skin:  Negative for rash.  Neurological:  Negative for dizziness, weakness, numbness and headaches.  Psychiatric/Behavioral:  Negative for agitation and behavioral problems.    Objective:   Today's Vitals: BP 138/79 (BP Location: Left Arm, Patient Position: Sitting, Cuff Size: Normal)   Pulse 71   Temp 97.9 F (36.6 C) (Oral)   Resp 18   Ht 6' (1.829 m)   Wt 209 lb (94.8 kg)   SpO2 98%   BMI 28.35 kg/m   Physical Exam Vitals reviewed.  Constitutional:      General:  He is not in acute distress.    Appearance: He is not diaphoretic.  HENT:     Head: Normocephalic and atraumatic.     Nose: Nose normal.     Mouth/Throat:     Mouth: Mucous membranes are moist.  Eyes:     General: No scleral icterus.    Extraocular Movements: Extraocular movements intact.  Cardiovascular:     Rate and Rhythm: Normal rate and regular rhythm.     Pulses: Normal pulses.     Heart sounds: Normal heart sounds. No murmur heard. Pulmonary:     Breath sounds: Normal breath sounds. No wheezing or rales.  Abdominal:     Palpations: Abdomen is soft.     Tenderness: There is no abdominal tenderness.  Musculoskeletal:     Cervical back: Neck supple. No tenderness.     Right lower leg: No edema.     Left lower leg: No edema.  Skin:    General: Skin is warm.      Findings: Rash (Seborrheic keratosis on forearms - papular rash) present.  Neurological:     General: No focal deficit present.     Mental Status: He is alert and oriented to person, place, and time.  Psychiatric:        Mood and Affect: Mood normal.        Behavior: Behavior normal.    Assessment & Plan:   Problem List Items Addressed This Visit       Cardiovascular and Mediastinum   Essential hypertension, benign    BP Readings from Last 1 Encounters:  08/27/20 138/79  Well-controlled with Lisinopril Counseled for compliance with the medications Advised DASH diet and moderate exercise/walking, at least 150 mins/week      Relevant Orders   CBC   CMP14+EGFR     Endocrine   Diabetes mellitus without complication (HCC)    MBW4Y: 6.1 On Glipizide 2.5 mg QD If persistently below 6.5, will DC Glipizide - may switch to Metformin if needed Advised to follow diabetic diet On ACEi F/u CMP and lipid panel Diabetic eye exam: Advised to follow up with Ophthalmology for diabetic eye exam       Relevant Medications   glipiZIDE (GLUCOTROL XL) 2.5 MG 24 hr tablet   Other Relevant Orders   CMP14+EGFR   HgB A1c   Lipid panel   Hypothyroidism    Lab Results  Component Value Date   TSH 4.440 04/15/2020  On Levothyroxine Check TSH and free T4      Relevant Orders   TSH + free T4     Nervous and Auditory   Hearing loss    Uses hearing aide        Genitourinary   BPH (benign prostatic hyperplasia)    On Tamsulosin 0.8 mg QD       Relevant Medications   tamsulosin (FLOMAX) 0.4 MG CAPS capsule     Other   Gout    On Allopurinol No recent gout flares Check uric acid level      Relevant Orders   Uric acid   Other Visit Diagnoses     Encounter to establish care    -  Primary Care established History and medications reviewed with the patient   Relevant Orders   CBC   Chronic idiopathic constipation       Relevant Medications   docusate sodium (COLACE)  100 MG capsule       Outpatient Encounter Medications as of 08/27/2020  Medication Sig  allopurinol (ZYLOPRIM) 100 MG tablet Take 1 tablet (100 mg total) by mouth daily.   aspirin 81 MG tablet Take 81 mg by mouth daily.   docusate sodium (COLACE) 100 MG capsule Take 1 capsule (100 mg total) by mouth daily as needed for mild constipation.   levothyroxine (SYNTHROID) 75 MCG tablet Take 1 tablet (75 mcg total) by mouth daily.   lisinopril (ZESTRIL) 5 MG tablet Take 1 tablet (5 mg total) by mouth daily.   polyethylene glycol powder (GLYCOLAX/MIRALAX) 17 GM/SCOOP powder Take 17 g by mouth daily as needed for mild constipation or moderate constipation.    [DISCONTINUED] glipiZIDE (GLUCOTROL XL) 2.5 MG 24 hr tablet Take 1 tablet by mouth once daily with breakfast   [DISCONTINUED] tamsulosin (FLOMAX) 0.4 MG CAPS capsule Take 2 capsules (0.8 mg total) by mouth daily.   glipiZIDE (GLUCOTROL XL) 2.5 MG 24 hr tablet Take 1 tablet (2.5 mg total) by mouth daily with breakfast.   tamsulosin (FLOMAX) 0.4 MG CAPS capsule Take 2 capsules (0.8 mg total) by mouth daily.   No facility-administered encounter medications on file as of 08/27/2020.    Follow-up: Return in about 4 months (around 12/27/2020) for DM.   Lindell Spar, MD

## 2020-08-27 NOTE — Patient Instructions (Signed)
Please continue taking medications as prescribed.  Please take a least 64 ounces of fluid in a day.  Please take Colace as needed for constipation.  Please get fasting blood tests done before the next visit.

## 2020-08-27 NOTE — Assessment & Plan Note (Signed)
Lab Results  Component Value Date   TSH 4.440 04/15/2020   On Levothyroxine Check TSH and free T4

## 2020-08-27 NOTE — Assessment & Plan Note (Signed)
HbA1C: 6.1 On Glipizide 2.5 mg QD If persistently below 6.5, will DC Glipizide - may switch to Metformin if needed Advised to follow diabetic diet On ACEi F/u CMP and lipid panel Diabetic eye exam: Advised to follow up with Ophthalmology for diabetic eye exam

## 2020-08-27 NOTE — Assessment & Plan Note (Signed)
On Tamsulosin 0.8 mg QD

## 2020-08-27 NOTE — Assessment & Plan Note (Signed)
BP Readings from Last 1 Encounters:  08/27/20 138/79   Well-controlled with Lisinopril Counseled for compliance with the medications Advised DASH diet and moderate exercise/walking, at least 150 mins/week

## 2020-09-19 ENCOUNTER — Other Ambulatory Visit: Payer: Self-pay | Admitting: *Deleted

## 2020-09-19 ENCOUNTER — Ambulatory Visit (INDEPENDENT_AMBULATORY_CARE_PROVIDER_SITE_OTHER): Payer: Medicare Other | Admitting: *Deleted

## 2020-09-19 ENCOUNTER — Other Ambulatory Visit: Payer: Self-pay

## 2020-09-19 DIAGNOSIS — Z0000 Encounter for general adult medical examination without abnormal findings: Secondary | ICD-10-CM | POA: Diagnosis not present

## 2020-09-19 DIAGNOSIS — E119 Type 2 diabetes mellitus without complications: Secondary | ICD-10-CM

## 2020-09-19 MED ORDER — LISINOPRIL 5 MG PO TABS: 5.0000 mg | ORAL_TABLET | Freq: Every day | ORAL | 1 refills | Status: DC

## 2020-09-19 MED ORDER — GLIPIZIDE ER 2.5 MG PO TB24: 2.5000 mg | ORAL_TABLET | Freq: Every day | ORAL | 1 refills | Status: DC

## 2020-09-19 NOTE — Patient Instructions (Signed)
Daniel Mercado , Thank you for taking time to come for your Medicare Wellness Visit. I appreciate your ongoing commitment to your health goals. Please review the following plan we discussed and let me know if I can assist you in the future.   Screening recommendations/referrals: Recommended yearly ophthalmology/optometry visit for glaucoma screening and checkup Recommended yearly dental visit for hygiene and checkup  Vaccinations: Influenza vaccine: Due now Pneumococcal vaccine: Completed Tdap vaccine: Due 11-15-25  Shingles vaccine: Due now    Advanced directives: Copy requested  Conditions/risks identified: Hypertension, Diabetes  Next appointment: 1 year   Preventive Care 22 Years and Older, Male Preventive care refers to lifestyle choices and visits with your health care provider that can promote health and wellness. What does preventive care include? A yearly physical exam. This is also called an annual well check. Dental exams once or twice a year. Routine eye exams. Ask your health care provider how often you should have your eyes checked. Personal lifestyle choices, including: Daily care of your teeth and gums. Regular physical activity. Eating a healthy diet. Avoiding tobacco and drug use. Limiting alcohol use. Practicing safe sex. Taking low doses of aspirin every day. Taking vitamin and mineral supplements as recommended by your health care provider. What happens during an annual well check? The services and screenings done by your health care provider during your annual well check will depend on your age, overall health, lifestyle risk factors, and family history of disease. Counseling  Your health care provider may ask you questions about your: Alcohol use. Tobacco use. Drug use. Emotional well-being. Home and relationship well-being. Sexual activity. Eating habits. History of falls. Memory and ability to understand (cognition). Work and work  Statistician. Screening  You may have the following tests or measurements: Height, weight, and BMI. Blood pressure. Lipid and cholesterol levels. These may be checked every 5 years, or more frequently if you are over 52 years old. Skin check. Lung cancer screening. You may have this screening every year starting at age 48 if you have a 30-pack-year history of smoking and currently smoke or have quit within the past 15 years. Fecal occult blood test (FOBT) of the stool. You may have this test every year starting at age 48. Flexible sigmoidoscopy or colonoscopy. You may have a sigmoidoscopy every 5 years or a colonoscopy every 10 years starting at age 7. Prostate cancer screening. Recommendations will vary depending on your family history and other risks. Hepatitis C blood test. Hepatitis B blood test. Sexually transmitted disease (STD) testing. Diabetes screening. This is done by checking your blood sugar (glucose) after you have not eaten for a while (fasting). You may have this done every 1-3 years. Abdominal aortic aneurysm (AAA) screening. You may need this if you are a current or former smoker. Osteoporosis. You may be screened starting at age 33 if you are at high risk. Talk with your health care provider about your test results, treatment options, and if necessary, the need for more tests. Vaccines  Your health care provider may recommend certain vaccines, such as: Influenza vaccine. This is recommended every year. Tetanus, diphtheria, and acellular pertussis (Tdap, Td) vaccine. You may need a Td booster every 10 years. Zoster vaccine. You may need this after age 53. Pneumococcal 13-valent conjugate (PCV13) vaccine. One dose is recommended after age 71. Pneumococcal polysaccharide (PPSV23) vaccine. One dose is recommended after age 18. Talk to your health care provider about which screenings and vaccines you need and how often you need  them. This information is not intended to replace  advice given to you by your health care provider. Make sure you discuss any questions you have with your health care provider. Document Released: 01/29/2015 Document Revised: 09/22/2015 Document Reviewed: 11/03/2014 Elsevier Interactive Patient Education  2017 [place] Prevention in the Home Falls can cause injuries. They can happen to people of all ages. There are many things you can do to make your home safe and to help prevent falls. What can I do on the outside of my home? Regularly fix the edges of walkways and driveways and fix any cracks. Remove anything that might make you trip as you walk through a door, such as a raised step or threshold. Trim any bushes or trees on the path to your home. Use bright outdoor lighting. Clear any walking paths of anything that might make someone trip, such as rocks or tools. Regularly check to see if handrails are loose or broken. Make sure that both sides of any steps have handrails. Any raised decks and porches should have guardrails on the edges. Have any leaves, snow, or ice cleared regularly. Use sand or salt on walking paths during winter. Clean up any spills in your garage right away. This includes oil or grease spills. What can I do in the bathroom? Use night lights. Install grab bars by the toilet and in the tub and shower. Do not use towel bars as grab bars. Use non-skid mats or decals in the tub or shower. If you need to sit down in the shower, use a plastic, non-slip stool. Keep the floor dry. Clean up any water that spills on the floor as soon as it happens. Remove soap buildup in the tub or shower regularly. Attach bath mats securely with double-sided non-slip rug tape. Do not have throw rugs and other things on the floor that can make you trip. What can I do in the bedroom? Use night lights. Make sure that you have a light by your bed that is easy to reach. Do not use any sheets or blankets that are too big for your bed.  They should not hang down onto the floor. Have a firm chair that has side arms. You can use this for support while you get dressed. Do not have throw rugs and other things on the floor that can make you trip. What can I do in the kitchen? Clean up any spills right away. Avoid walking on wet floors. Keep items that you use a lot in easy-to-reach places. If you need to reach something above you, use a strong step stool that has a grab bar. Keep electrical cords out of the way. Do not use floor polish or wax that makes floors slippery. If you must use wax, use non-skid floor wax. Do not have throw rugs and other things on the floor that can make you trip. What can I do with my stairs? Do not leave any items on the stairs. Make sure that there are handrails on both sides of the stairs and use them. Fix handrails that are broken or loose. Make sure that handrails are as long as the stairways. Check any carpeting to make sure that it is firmly attached to the stairs. Fix any carpet that is loose or worn. Avoid having throw rugs at the top or bottom of the stairs. If you do have throw rugs, attach them to the floor with carpet tape. Make sure that you have a light switch at  the top of the stairs and the bottom of the stairs. If you do not have them, ask someone to add them for you. What else can I do to help prevent falls? Wear shoes that: Do not have high heels. Have rubber bottoms. Are comfortable and fit you well. Are closed at the toe. Do not wear sandals. If you use a stepladder: Make sure that it is fully opened. Do not climb a closed stepladder. Make sure that both sides of the stepladder are locked into place. Ask someone to hold it for you, if possible. Clearly mark and make sure that you can see: Any grab bars or handrails. First and last steps. Where the edge of each step is. Use tools that help you move around (mobility aids) if they are needed. These  include: Canes. Walkers. Scooters. Crutches. Turn on the lights when you go into a dark area. Replace any light bulbs as soon as they burn out. Set up your furniture so you have a clear path. Avoid moving your furniture around. If any of your floors are uneven, fix them. If there are any pets around you, be aware of where they are. Review your medicines with your doctor. Some medicines can make you feel dizzy. This can increase your chance of falling. Ask your doctor what other things that you can do to help prevent falls. This information is not intended to replace advice given to you by your health care provider. Make sure you discuss any questions you have with your health care provider. Document Released: 10/29/2008 Document Revised: 06/10/2015 Document Reviewed: 02/06/2014 Elsevier Interactive Patient Education  2017 [employer].

## 2020-09-19 NOTE — Progress Notes (Signed)
Subjective:   Daniel Mercado is a 87 y.o. male who presents for Medicare Annual/Subsequent preventive examination.  I connected with  Daniel Mercado on 09/19/20 by an audio enabled telemedicine application and verified that I am speaking with the correct person using two identifiers.   I discussed the limitations, risks, security and privacy concerns of performing an evaluation and management service by telephone and the availability of in person appointments. I also discussed with the patient that there may be a patient responsible charge related to this service. The patient expressed understanding and verbally consented to this telephonic visit.   Review of Systems           Objective:    There were no vitals filed for this visit. There is no height or weight on file to calculate BMI.  Advanced Directives 11/06/2014 10/09/2014  Does Patient Have a Medical Advance Directive? Yes Yes  Type of Advance Directive Living will [place] in Chart? No - copy requested -  Would patient like information on creating a medical advance directive? No - patient declined information -    Current Medications (verified) Outpatient Encounter Medications as of 09/19/2020  Medication Sig   allopurinol (ZYLOPRIM) 100 MG tablet Take 1 tablet (100 mg total) by mouth daily.   aspirin 81 MG tablet Take 81 mg by mouth daily.   docusate sodium (COLACE) 100 MG capsule Take 1 capsule (100 mg total) by mouth daily as needed for mild constipation.   glipiZIDE (GLUCOTROL XL) 2.5 MG 24 hr tablet Take 1 tablet (2.5 mg total) by mouth daily with breakfast.   levothyroxine (SYNTHROID) 75 MCG tablet Take 1 tablet (75 mcg total) by mouth daily.   lisinopril (ZESTRIL) 5 MG tablet Take 1 tablet (5 mg total) by mouth daily.   polyethylene glycol powder (GLYCOLAX/MIRALAX) 17 GM/SCOOP powder Take 17 g by mouth daily as needed for mild constipation or moderate  constipation.    tamsulosin (FLOMAX) 0.4 MG CAPS capsule Take 2 capsules (0.8 mg total) by mouth daily.   No facility-administered encounter medications on file as of 09/19/2020.    Allergies (verified) Patient has no known allergies.   History: Past Medical History:  Diagnosis Date   Erectile dysfunction    Gout    History of colon polyps    Left ureteral stone    Nocturia    Peripheral neuropathy    Sciatica    Type 2 diabetes mellitus ([place])    Past Surgical History:  Procedure Laterality Date   COLONOSCOPY  last one 2009   [place]   Family History  Problem Relation Age of Onset   Diabetes Other    Social History   Socioeconomic History   Marital status: Married    Spouse name: Not on file   Number of children: Not on file   Years of education: Not on file   Highest education level: Not on file  Occupational History   Not on file  Tobacco Use   Smoking status: Former   Smokeless tobacco: Current    Types: Chew  Substance and Sexual Activity   Alcohol use: No   Drug use: No   Sexual activity: Not on file  Other Topics Concern   Not on file  Social History Narrative   Not on file   Social Determinants of Health   Financial Resource Strain: Not on file  Food Insecurity: Not on file  Transportation Needs: Not on file  Physical Activity: Not on file  Stress: Not on file  Social Connections: Not on file    Tobacco Counseling Ready to quit: Not Answered Counseling given: Not Answered   Clinical Intake:                 Diabetic?Nutrition Risk Assessment:  Has the patient had any N/V/D within the last 2 months?  No  Does the patient have any non-healing wounds?  No  Has the patient had any unintentional weight loss or weight gain?  No   Diabetes:  Is the patient diabetic?  Yes  If diabetic, was a CBG obtained today?  No  Did the patient bring in their glucometer from home?  No  How often do you monitor your  CBG's? Sunday mornings.   Financial Strains and Diabetes Management:  Are you having any financial strains with the device, your supplies or your medication? No .  Does the patient want to be seen by Chronic Care Management for management of their diabetes?  No  Would the patient like to be referred to a Nutritionist or for Diabetic Management?  No   Diabetic Exams:  Diabetic Eye Exam: Completed 03-09-20.   Diabetic Foot Exam: Pt has been advised about the importance in completing this exam. Pt is scheduled for diabetic foot exam on 01-10-21.           Activities of Daily Living In your present state of health, do you have any difficulty performing the following activities: 08/27/2020  Hearing? N  Vision? N  Difficulty concentrating or making decisions? N  Walking or climbing stairs? N  Dressing or bathing? N  Doing errands, shopping? N  Some recent data might be hidden    Patient Care Team: Lindell Spar, MD as PCP - General (Internal Medicine)  Indicate any recent Medical Services you may have received from other than [facility] providers in the past year (date may be approximate).     Assessment:   This is a routine wellness examination for Daniel Mercado.  Hearing/Vision screen No results found.  Dietary issues and exercise activities discussed:     Goals Addressed   None   Depression Screen PHQ 2/9 Scores 08/27/2020 04/15/2020 02/07/2017 12/30/2015 06/27/2015 12/26/2014  PHQ - 2 Score 0 0 0 0 0 0    Fall Risk Fall Risk  08/27/2020 04/15/2020 11/20/2019 06/03/2019 04/28/2019  Falls in the past year? 0 0 0 0 0  Comment - - - - -  Number falls in past yr: 0 - - - -  Injury with Fall? 0 - - - -  Risk for fall due to : No Fall Risks - - - -  Follow up Falls evaluation completed Falls evaluation completed Falls evaluation completed Falls evaluation completed Falls evaluation completed    [place]:  Any stairs in or around the home? Yes  If  so, are there any without handrails? Yes  Home free of loose throw rugs in walkways, pet beds, electrical cords, etc? Yes  Adequate lighting in your home to reduce risk of falls? Yes   ASSISTIVE DEVICES UTILIZED TO PREVENT FALLS:  Life alert? No  Use of a cane, walker or w/c? No  Grab bars in the bathroom? Yes  Shower chair or bench in shower? Yes  Elevated toilet seat or a handicapped toilet? Yes   TIMED UP AND GO:  Was the  test performed? No .  Length of time to ambulate 10 feet: NA sec.    Cognitive Function: MMSE - Mini Mental State Exam 06/03/2019  Orientation to time 5  Orientation to Place 5  Registration 3  Attention/ Calculation 3  Recall 2        Immunizations Immunization History  Administered Date(s) Administered   Fluad Quad(high Dose 65+) 11/20/2019   Influenza Split 01/02/2013   Influenza, High Dose Seasonal PF 10/29/2017   Influenza,inj,Quad PF,6+ Mos 11/18/2013, 12/24/2014, 12/28/2016   Influenza-Unspecified 11/29/2011, 11/16/2015, 10/22/2017   Moderna Sars-Covid-2 Vaccination 11/10/2019   Pneumococcal Conjugate-13 01/01/2014   Pneumococcal-Unspecified 01/15/2010   Tdap 11/16/2015    TDAP status: Up to date  Flu Vaccine status: Due, Education has been provided regarding the importance of this vaccine. Advised may receive this vaccine at local pharmacy or Health Dept. Aware to provide a copy of the vaccination record if obtained from local pharmacy or Health Dept. Verbalized acceptance and understanding.  Pneumococcal vaccine status: Up to date  Covid-19 vaccine status: Completed vaccines  Qualifies for Shingles Vaccine? Yes   Zostavax completed No   Shingrix Completed?: No.    Education has been provided regarding the importance of this vaccine. Patient has been advised to call insurance company to determine out of pocket expense if they have not yet received this vaccine. Advised may also receive vaccine at local pharmacy or Health Dept.  Verbalized acceptance and understanding.  Screening Tests Health Maintenance  Topic Date Due   Zoster Vaccines- Shingrix (1 of 2) Never done   FOOT EXAM  01/22/2019   COVID-19 Vaccine (2 - Moderna risk series) 12/08/2019   INFLUENZA VACCINE  08/16/2020   HEMOGLOBIN A1C  10/15/2020   OPHTHALMOLOGY EXAM  03/09/2021   TETANUS/TDAP  11/15/2025   PNA vac Low Risk Adult  Completed   HPV VACCINES  Aged Out    Health Maintenance  Health Maintenance Due  Topic Date Due   Zoster Vaccines- Shingrix (1 of 2) Never done   FOOT EXAM  01/22/2019   COVID-19 Vaccine (2 - Moderna risk series) 12/08/2019   INFLUENZA VACCINE  08/16/2020    Colorectal cancer screening: No longer required.   Lung Cancer Screening: (Low Dose CT Chest recommended if Age 38-80 years, 30 pack-year currently smoking OR have quit w/in 15years.) does not qualify.   Lung Cancer Screening Referral: NA  Additional Screening:  Hepatitis C Screening: does not qualify; Completed  Vision Screening: Recommended annual ophthalmology exams for early detection of glaucoma and other disorders of the eye. Is the patient up to date with their annual eye exam?  Yes  Who is the provider or what is the name of the office in which the patient attends annual eye exams? [facility] If pt is not established with a provider, would they like to be referred to a provider to establish care? No .   Dental Screening: Recommended annual dental exams for proper oral hygiene  Community Resource Referral / Chronic Care Management: CRR required this visit?  No   CCM required this visit?  No      Plan:     I have personally reviewed and noted the following in the patient's chart:   Medical and social history Use of alcohol, tobacco or illicit drugs  Current medications and supplements including opioid prescriptions. Patient is not currently taking opioid prescriptions. Functional ability and status Nutritional status Physical  activity Advanced directives List of other physicians Hospitalizations, surgeries, and ER  visits in previous 12 months Vitals Screenings to include cognitive, depression, and falls Referrals and appointments  In addition, I have reviewed and discussed with patient certain preventive protocols, quality metrics, and best practice recommendations. A written personalized care plan for preventive services as well as general preventive health recommendations were provided to patient.     Shelda Altes, CMA   09/19/2020   Nurse Notes: This was a telehealth visit. The patient was at home. The provider was at home and was Ihor Dow, MD.

## 2020-09-21 ENCOUNTER — Other Ambulatory Visit: Payer: Self-pay | Admitting: Family Medicine

## 2020-09-21 DIAGNOSIS — E038 Other specified hypothyroidism: Secondary | ICD-10-CM

## 2020-09-23 ENCOUNTER — Other Ambulatory Visit: Payer: Self-pay | Admitting: *Deleted

## 2020-09-23 DIAGNOSIS — E038 Other specified hypothyroidism: Secondary | ICD-10-CM

## 2020-09-23 MED ORDER — LEVOTHYROXINE SODIUM 75 MCG PO TABS: 75.0000 ug | ORAL_TABLET | Freq: Every day | ORAL | 0 refills | Status: DC

## 2020-10-21 DIAGNOSIS — Z23 Encounter for immunization: Secondary | ICD-10-CM | POA: Diagnosis not present

## 2020-11-03 ENCOUNTER — Ambulatory Visit (INDEPENDENT_AMBULATORY_CARE_PROVIDER_SITE_OTHER): Payer: Medicare Other

## 2020-11-03 ENCOUNTER — Encounter (INDEPENDENT_AMBULATORY_CARE_PROVIDER_SITE_OTHER): Payer: Self-pay

## 2020-11-03 ENCOUNTER — Other Ambulatory Visit: Payer: Self-pay

## 2020-11-03 DIAGNOSIS — Z23 Encounter for immunization: Secondary | ICD-10-CM | POA: Diagnosis not present

## 2020-12-27 DIAGNOSIS — E038 Other specified hypothyroidism: Secondary | ICD-10-CM | POA: Diagnosis not present

## 2020-12-27 DIAGNOSIS — Z7689 Persons encountering health services in other specified circumstances: Secondary | ICD-10-CM | POA: Diagnosis not present

## 2020-12-27 DIAGNOSIS — E119 Type 2 diabetes mellitus without complications: Secondary | ICD-10-CM | POA: Diagnosis not present

## 2020-12-27 DIAGNOSIS — M1000 Idiopathic gout, unspecified site: Secondary | ICD-10-CM | POA: Diagnosis not present

## 2020-12-27 DIAGNOSIS — I10 Essential (primary) hypertension: Secondary | ICD-10-CM | POA: Diagnosis not present

## 2020-12-28 LAB — CMP14+EGFR
ALT: 10 IU/L (ref 0–44)
AST: 21 IU/L (ref 0–40)
Albumin/Globulin Ratio: 1.7 (ref 1.2–2.2)
Albumin: 4 g/dL (ref 3.6–4.6)
Alkaline Phosphatase: 59 IU/L (ref 44–121)
BUN/Creatinine Ratio: 11 (ref 10–24)
BUN: 13 mg/dL (ref 8–27)
Bilirubin Total: 0.6 mg/dL (ref 0.0–1.2)
CO2: 24 mmol/L (ref 20–29)
Calcium: 9.4 mg/dL (ref 8.6–10.2)
Chloride: 106 mmol/L (ref 96–106)
Creatinine, Ser: 1.2 mg/dL (ref 0.76–1.27)
Globulin, Total: 2.4 g/dL (ref 1.5–4.5)
Glucose: 100 mg/dL — ABNORMAL HIGH (ref 70–99)
Potassium: 5.1 mmol/L (ref 3.5–5.2)
Sodium: 142 mmol/L (ref 134–144)
Total Protein: 6.4 g/dL (ref 6.0–8.5)
eGFR: 59 mL/min/{1.73_m2} — ABNORMAL LOW (ref >59)

## 2020-12-28 LAB — CBC
Hematocrit: 41.2 % (ref 37.5–51.0)
Hemoglobin: 14.4 g/dL (ref 13.0–17.7)
MCH: 32.2 pg (ref 26.6–33.0)
MCHC: 35 g/dL (ref 31.5–35.7)
MCV: 92 fL (ref 79–97)
Platelets: 182 10*3/uL (ref 150–450)
RBC: 4.47 x10E6/uL (ref 4.14–5.80)
RDW: 12.4 % (ref 11.6–15.4)
WBC: 6 10*3/uL (ref 3.4–10.8)

## 2020-12-28 LAB — URIC ACID: Uric Acid: 5.2 mg/dL (ref 3.8–8.4)

## 2020-12-28 LAB — LIPID PANEL
Chol/HDL Ratio: 3.9 ratio (ref 0.0–5.0)
Cholesterol, Total: 162 mg/dL (ref 100–199)
HDL: 42 mg/dL (ref >39)
LDL Chol Calc (NIH): 105 mg/dL — ABNORMAL HIGH (ref 0–99)
Triglycerides: 81 mg/dL (ref 0–149)
VLDL Cholesterol Cal: 15 mg/dL (ref 5–40)

## 2020-12-28 LAB — TSH+FREE T4
Free T4: 1.25 ng/dL (ref 0.82–1.77)
TSH: 3.05 u[IU]/mL (ref 0.450–4.500)

## 2020-12-28 LAB — HEMOGLOBIN A1C
Est. average glucose Bld gHb Est-mCnc: 131 mg/dL
Hgb A1c MFr Bld: 6.2 % — ABNORMAL HIGH (ref 4.8–5.6)

## 2020-12-31 ENCOUNTER — Other Ambulatory Visit: Payer: Self-pay

## 2020-12-31 ENCOUNTER — Encounter: Payer: Self-pay | Admitting: Internal Medicine

## 2020-12-31 ENCOUNTER — Ambulatory Visit (INDEPENDENT_AMBULATORY_CARE_PROVIDER_SITE_OTHER): Payer: Medicare Other | Admitting: Internal Medicine

## 2020-12-31 ENCOUNTER — Other Ambulatory Visit: Payer: Self-pay | Admitting: *Deleted

## 2020-12-31 VITALS — BP 108/62 | HR 82 | Resp 18 | Ht 72.0 in | Wt 207.0 lb

## 2020-12-31 DIAGNOSIS — E038 Other specified hypothyroidism: Secondary | ICD-10-CM

## 2020-12-31 DIAGNOSIS — E1169 Type 2 diabetes mellitus with other specified complication: Secondary | ICD-10-CM

## 2020-12-31 DIAGNOSIS — M1000 Idiopathic gout, unspecified site: Secondary | ICD-10-CM

## 2020-12-31 DIAGNOSIS — I10 Essential (primary) hypertension: Secondary | ICD-10-CM

## 2020-12-31 DIAGNOSIS — N400 Enlarged prostate without lower urinary tract symptoms: Secondary | ICD-10-CM | POA: Diagnosis not present

## 2020-12-31 DIAGNOSIS — E039 Hypothyroidism, unspecified: Secondary | ICD-10-CM | POA: Diagnosis not present

## 2020-12-31 MED ORDER — LEVOTHYROXINE SODIUM 75 MCG PO TABS: 75.0000 ug | ORAL_TABLET | Freq: Every day | ORAL | 0 refills | Status: DC

## 2020-12-31 NOTE — Assessment & Plan Note (Signed)
On Tamsulosin 0.8 mg QD

## 2020-12-31 NOTE — Patient Instructions (Addendum)
Please stop taking Glipizide.  Please continue to take other medications as prescribed.  Please maintain adequate hydration by taking at least 64 ounces of fluid in a day.  Please take Miralax for constipation. Prune juice can also be helpful.

## 2020-12-31 NOTE — Assessment & Plan Note (Addendum)
Lab Results  Component Value Date   TSH 3.050 12/27/2020   On Levothyroxine Reviewed TSH and free T4

## 2020-12-31 NOTE — Progress Notes (Signed)
Established Patient Office Visit  Subjective:  Patient ID: Daniel Mercado, male    DOB: Nov 17, 1931  Age: 87 y.o. MRN: [id number]  CC:  Chief Complaint  Patient presents with   Follow-up    4 month follow up     HPI Daniel Mercado is a 87 y.o. male with past medical history of DM, BPH, DDD of lumbar spine s/p disc repair and gout who presents for f/u of his chronic medical conditions.  BP is well-controlled. Takes medications regularly. Patient denies headache, dizziness, chest pain, dyspnea or palpitations.   His HbA1C was 6.2. He takes Glipizide. Denies any polyuria or polyphagia. Does not recall trying any other medication for it.  Blood tests were discussed with the patient in detail.  He is due for PPSV23, but denies it today.  Past Medical History:  Diagnosis Date   Erectile dysfunction    Gout    History of colon polyps    Left ureteral stone    Nocturia    Peripheral neuropathy    Sciatica    Type 2 diabetes mellitus ([place])     Past Surgical History:  Procedure Laterality Date   COLONOSCOPY  last one 2009   [place]    Family History  Problem Relation Age of Onset   Diabetes Other     Social History   Socioeconomic History   Marital status: Married    Spouse name: Not on file   Number of children: Not on file   Years of education: Not on file   Highest education level: Not on file  Occupational History   Not on file  Tobacco Use   Smoking status: Former   Smokeless tobacco: Current    Types: Chew  Substance and Sexual Activity   Alcohol use: No   Drug use: No   Sexual activity: Not on file  Other Topics Concern   Not on file  Social History Narrative   Not on file   Social Determinants of Health   Financial Resource Strain: Low Risk    Difficulty of Paying Living Expenses: Not hard at all  Food Insecurity: No Food Insecurity   Worried About Charity fundraiser in the Last Year: Never true   [place] in  the Last Year: Never true  Transportation Needs: No Transportation Needs   Lack of Transportation (Medical): No   Lack of Transportation (Non-Medical): No  Physical Activity: Sufficiently Active   Days of Exercise per Week: 5 days   Minutes of Exercise per Session: 30 min  Stress: No Stress Concern Present   Feeling of Stress : Not at all  Social Connections: Moderately Integrated   Frequency of Communication with Friends and Family: More than three times a week   Frequency of Social Gatherings with Friends and Family: More than three times a week   Attends Religious Services: More than 4 times per year   Active Member of [employer] or Organizations: No   Attends Archivist Meetings: Never   Marital Status: Married  Human resources officer Violence: Not At Risk   Fear of Current or Ex-Partner: No   Emotionally Abused: No   Physically Abused: No   Sexually Abused: No    Outpatient Medications Prior to Visit  Medication Sig Dispense Refill   allopurinol (ZYLOPRIM) 100 MG tablet Take 1 tablet (100 mg total) by mouth daily. 90 tablet 1   aspirin 81 MG tablet  Take 81 mg by mouth daily.     docusate sodium (COLACE) 100 MG capsule Take 1 capsule (100 mg total) by mouth daily as needed for mild constipation. 30 capsule 5   lisinopril (ZESTRIL) 5 MG tablet Take 1 tablet (5 mg total) by mouth daily. 90 tablet 1   polyethylene glycol powder (GLYCOLAX/MIRALAX) 17 GM/SCOOP powder Take 17 g by mouth daily as needed for mild constipation or moderate constipation.      tamsulosin (FLOMAX) 0.4 MG CAPS capsule Take 2 capsules (0.8 mg total) by mouth daily. 180 capsule 1   glipiZIDE (GLUCOTROL XL) 2.5 MG 24 hr tablet Take 1 tablet (2.5 mg total) by mouth daily with breakfast. 90 tablet 1   levothyroxine (SYNTHROID) 75 MCG tablet Take 1 tablet (75 mcg total) by mouth daily. 90 tablet 0   No facility-administered medications prior to visit.    No Known Allergies  ROS Review of Systems   Constitutional:  Negative for chills and fever.  HENT:  Negative for congestion and sore throat.   Eyes:  Negative for pain and discharge.  Respiratory:  Negative for cough and shortness of breath.   Cardiovascular:  Negative for chest pain and palpitations.  Gastrointestinal:  Negative for constipation, diarrhea, nausea and vomiting.  Endocrine: Negative for polydipsia and polyuria.  Genitourinary:  Negative for dysuria and hematuria.  Musculoskeletal:  Negative for neck pain and neck stiffness.  Skin:  Negative for rash.  Neurological:  Negative for dizziness, weakness, numbness and headaches.  Psychiatric/Behavioral:  Negative for agitation and behavioral problems.      Objective:    Physical Exam Vitals reviewed.  Constitutional:      General: He is not in acute distress.    Appearance: He is not diaphoretic.  HENT:     Head: Normocephalic and atraumatic.     Nose: Nose normal.     Mouth/Throat:     Mouth: Mucous membranes are moist.  Eyes:     General: No scleral icterus.    Extraocular Movements: Extraocular movements intact.  Cardiovascular:     Rate and Rhythm: Normal rate and regular rhythm.     Pulses: Normal pulses.     Heart sounds: Normal heart sounds. No murmur heard. Pulmonary:     Breath sounds: Normal breath sounds. No wheezing or rales.  Abdominal:     Palpations: Abdomen is soft.     Tenderness: There is no abdominal tenderness.  Musculoskeletal:     Cervical back: Neck supple. No tenderness.     Right lower leg: No edema.     Left lower leg: No edema.  Skin:    General: Skin is warm.     Findings: Rash (Seborrheic keratosis on forearms - papular rash) present.  Neurological:     General: No focal deficit present.     Mental Status: He is alert and oriented to person, place, and time.  Psychiatric:        Mood and Affect: Mood normal.        Behavior: Behavior normal.    BP 108/62 (BP Location: Left Arm, Patient Position: Sitting, Cuff Size:  Normal)    Pulse 82    Resp 18    Ht 6' (1.829 m)    Wt 207 lb 0.6 oz (93.9 kg)    SpO2 95%    BMI 28.08 kg/m  Wt Readings from Last 3 Encounters:  12/31/20 207 lb 0.6 oz (93.9 kg)  08/27/20 209 lb (94.8 kg)  04/15/20 211  lb (95.7 kg)    Lab Results  Component Value Date   TSH 3.050 12/27/2020   Lab Results  Component Value Date   WBC 6.0 12/27/2020   HGB 14.4 12/27/2020   HCT 41.2 12/27/2020   MCV 92 12/27/2020   PLT 182 12/27/2020   Lab Results  Component Value Date   NA 142 12/27/2020   K 5.1 12/27/2020   CO2 24 12/27/2020   GLUCOSE 100 (H) 12/27/2020   BUN 13 12/27/2020   CREATININE 1.20 12/27/2020   BILITOT 0.6 12/27/2020   ALKPHOS 59 12/27/2020   AST 21 12/27/2020   ALT 10 12/27/2020   PROT 6.4 12/27/2020   ALBUMIN 4.0 12/27/2020   CALCIUM 9.4 12/27/2020   ANIONGAP 9 10/09/2014   EGFR 59 (L) 12/27/2020   Lab Results  Component Value Date   CHOL 162 12/27/2020   Lab Results  Component Value Date   HDL 42 12/27/2020   Lab Results  Component Value Date   LDLCALC 105 (H) 12/27/2020   Lab Results  Component Value Date   TRIG 81 12/27/2020   Lab Results  Component Value Date   CHOLHDL 3.9 12/27/2020   Lab Results  Component Value Date   HGBA1C 6.2 (H) 12/27/2020      Assessment & Plan:   Problem List Items Addressed This Visit       Cardiovascular and Mediastinum   Essential hypertension, benign - Primary    BP Readings from Last 1 Encounters:  12/31/20 108/62  Well-controlled with Lisinopril Counseled for compliance with the medications Advised DASH diet and moderate exercise/walking as tolerated        Endocrine   Type 2 diabetes mellitus with other specified complication ([place])    Lab Results  Component Value Date   HGBA1C 6.2 (H) 12/27/2020   On Glipizide, tightly controlled DM, would avoid glipizide to avoid risk of hypoglycemia DC glipizide Will check HbA1C in the next visit and if needed, will start Metformin Advised to  follow diabetic diet On statin and ACEi Reviewed CMP and lipid panel Diabetic foot exam: Today Diabetic eye exam: Advised to follow up with Ophthalmology for diabetic eye exam       Hypothyroidism    Lab Results  Component Value Date   TSH 3.050 12/27/2020  On Levothyroxine Reviewed TSH and free T4        Genitourinary   BPH (benign prostatic hyperplasia)    On Tamsulosin 0.8 mg QD         Other   Gout    Lab Results  Component Value Date   LABURIC 5.2 12/27/2020  On allopurinol       No orders of the defined types were placed in this encounter.   Follow-up: Return in about 4 months (around 05/01/2021) for DM and HTN.    Lindell Spar, MD

## 2020-12-31 NOTE — Assessment & Plan Note (Signed)
Lab Results  Component Value Date   HGBA1C 6.2 (H) 12/27/2020    On Glipizide, tightly controlled DM, would avoid glipizide to avoid risk of hypoglycemia DC glipizide Will check HbA1C in the next visit and if needed, will start Metformin Advised to follow diabetic diet On statin and ACEi Reviewed CMP and lipid panel Diabetic foot exam: Today Diabetic eye exam: Advised to follow up with Ophthalmology for diabetic eye exam

## 2020-12-31 NOTE — Assessment & Plan Note (Signed)
Lab Results  Component Value Date   LABURIC 5.2 12/27/2020   On allopurinol 

## 2020-12-31 NOTE — Assessment & Plan Note (Signed)
BP Readings from Last 1 Encounters:  12/31/20 108/62   Well-controlled with Lisinopril Counseled for compliance with the medications Advised DASH diet and moderate exercise/walking as tolerated

## 2021-01-01 ENCOUNTER — Other Ambulatory Visit: Payer: Self-pay | Admitting: Family Medicine

## 2021-01-03 ENCOUNTER — Other Ambulatory Visit: Payer: Self-pay | Admitting: Family Medicine

## 2021-01-04 ENCOUNTER — Other Ambulatory Visit: Payer: Self-pay | Admitting: *Deleted

## 2021-01-04 ENCOUNTER — Telehealth: Payer: Self-pay | Admitting: Internal Medicine

## 2021-01-04 MED ORDER — ALLOPURINOL 100 MG PO TABS: 100.0000 mg | ORAL_TABLET | Freq: Every day | ORAL | 1 refills | Status: DC

## 2021-01-04 NOTE — Telephone Encounter (Signed)
Pt needs refill on   allopurinol (ZYLOPRIM) 100 MG tablet    [facility] [place]

## 2021-01-04 NOTE — Telephone Encounter (Signed)
Pt med sent to pharmacy

## 2021-02-21 ENCOUNTER — Other Ambulatory Visit: Payer: Self-pay | Admitting: Internal Medicine

## 2021-02-21 DIAGNOSIS — N400 Enlarged prostate without lower urinary tract symptoms: Secondary | ICD-10-CM

## 2021-03-03 ENCOUNTER — Other Ambulatory Visit: Payer: Self-pay

## 2021-03-03 ENCOUNTER — Ambulatory Visit: Payer: Medicare Other

## 2021-03-03 ENCOUNTER — Encounter: Payer: Self-pay | Admitting: Orthopedic Surgery

## 2021-03-03 ENCOUNTER — Ambulatory Visit (INDEPENDENT_AMBULATORY_CARE_PROVIDER_SITE_OTHER): Payer: Medicare Other | Admitting: Orthopedic Surgery

## 2021-03-03 VITALS — BP 148/75 | HR 84 | Ht 72.0 in | Wt 200.0 lb

## 2021-03-03 DIAGNOSIS — M25561 Pain in right knee: Secondary | ICD-10-CM | POA: Diagnosis not present

## 2021-03-03 NOTE — Progress Notes (Signed)
New patient new problem  88 year old male twisted his right knee while putting something in his refrigerator.  He reports severe pain and trouble weightbearing.  He says it is much better now but he came in to get it checked out.  Physical Exam Constitutional:      Appearance: Normal appearance. He is normal weight.  HENT:     Head: Normocephalic and atraumatic.  Cardiovascular:     Rate and Rhythm: Regular rhythm.  Musculoskeletal:       Legs:  Skin:    General: Skin is warm.  Neurological:     General: No focal deficit present.     Mental Status: He is alert.  Psychiatric:        Mood and Affect: Mood normal.    X-ray right knee chondrocalcinosis mild arthritis  Patient would like a supportive brace which is fine we put him in an economy hinged brace follow-up with [place] as needed  Encounter Diagnosis  Name Primary?   Acute pain of right knee Yes

## 2021-03-03 NOTE — Progress Notes (Signed)
NEW PROBLEM//OFFICE VISIT   Chief Complaint  Patient presents with   Knee Pain    Right s/p twist  injury about 2 weeks ago   88 year old male twisted his knee complains of pain he was putting something in the refrigerator it now feels a lot better    ROS: Negative for back problems although he had back surgery  BP (!) 148/75    Pulse 84    Ht 6' (1.829 m)    Wt 200 lb (90.7 kg)    BMI 27.12 kg/m   Body mass index is 27.12 kg/m.  General appearance: Well-developed well-nourished no gross deformities  Cardiovascular normal pulse and perfusion normal color without edema  Neurologically no sensation loss or deficits or pathologic reflexes  Psychological: Awake alert and oriented x3 mood and affect normal  Skin no lacerations or ulcerations no nodularity no palpable masses, no erythema or nodularity  Musculoskeletal: Tenderness none right knee full range of motion normal strength normal stability no swelling  ROS Negative  Past Medical History:  Diagnosis Date   Erectile dysfunction    Gout    History of colon polyps    Left ureteral stone    Nocturia    Peripheral neuropathy    Sciatica    Type 2 diabetes mellitus ([place])     Past Surgical History:  Procedure Laterality Date   COLONOSCOPY  last one 2009   [place]    Family History  Problem Relation Age of Onset   Diabetes Other    Social History   Tobacco Use   Smoking status: Former   Smokeless tobacco: Current    Types: Chew  Substance Use Topics   Alcohol use: No   Drug use: No    No Known Allergies  Current Meds  Medication Sig   allopurinol (ZYLOPRIM) 100 MG tablet Take 1 tablet (100 mg total) by mouth daily.   aspirin 81 MG tablet Take 81 mg by mouth daily.   docusate sodium (COLACE) 100 MG capsule Take 1 capsule (100 mg total) by mouth daily as needed for mild constipation.   levothyroxine (SYNTHROID) 75 MCG tablet Take 1 tablet (75 mcg total) by mouth daily.    lisinopril (ZESTRIL) 5 MG tablet Take 1 tablet (5 mg total) by mouth daily.   polyethylene glycol powder (GLYCOLAX/MIRALAX) 17 GM/SCOOP powder Take 17 g by mouth daily as needed for mild constipation or moderate constipation.    tamsulosin (FLOMAX) 0.4 MG CAPS capsule Take 2 capsules by mouth once daily     MEDICAL DECISION MAKING  A.  Encounter Diagnosis  Name Primary?   Acute pain of right knee Yes    Knee brace is fine follow-up as needed  No orders of the defined types were placed in this encounter.    Arther Abbott, MD  03/03/2021 9:59 AM

## 2021-03-10 DIAGNOSIS — Z961 Presence of intraocular lens: Secondary | ICD-10-CM | POA: Diagnosis not present

## 2021-03-10 DIAGNOSIS — E119 Type 2 diabetes mellitus without complications: Secondary | ICD-10-CM | POA: Diagnosis not present

## 2021-03-10 LAB — HM DIABETES EYE EXAM

## 2021-03-14 ENCOUNTER — Encounter: Payer: Self-pay | Admitting: *Deleted

## 2021-03-30 ENCOUNTER — Other Ambulatory Visit: Payer: Self-pay | Admitting: Internal Medicine

## 2021-03-30 DIAGNOSIS — E038 Other specified hypothyroidism: Secondary | ICD-10-CM

## 2021-03-30 DIAGNOSIS — E119 Type 2 diabetes mellitus without complications: Secondary | ICD-10-CM

## 2021-04-06 ENCOUNTER — Other Ambulatory Visit: Payer: Self-pay

## 2021-04-06 ENCOUNTER — Telehealth: Payer: Self-pay

## 2021-04-06 ENCOUNTER — Ambulatory Visit (INDEPENDENT_AMBULATORY_CARE_PROVIDER_SITE_OTHER): Payer: Medicare Other | Admitting: Orthopedic Surgery

## 2021-04-06 DIAGNOSIS — M25461 Effusion, right knee: Secondary | ICD-10-CM

## 2021-04-06 MED ORDER — PREDNISONE 10 MG PO TABS: 10.0000 mg | ORAL_TABLET | Freq: Two times a day (BID) | ORAL | 0 refills | Status: AC

## 2021-04-06 NOTE — Patient Instructions (Addendum)
Ice the knee 3 x a a day for 3 days  ? ?Take this medicine: ?Meds ordered this encounter  ?Medications  ? predniSONE (DELTASONE) 10 MG tablet  ?  Sig: Take 1 tablet (10 mg total) by mouth 2 (two) times daily with a meal for 14 days.  ?  Dispense:  28 tablet  ?  Refill:  0  ? ?Wear the ace wrap but remove to bathe  ? ?I sent the fluid off for analysis, I ll call you with the results  ?

## 2021-04-06 NOTE — Telephone Encounter (Signed)
[facility] confirmation lab pickup: [id number] ?To be picked up in outside lock box 04/06/21 ?

## 2021-04-06 NOTE — Progress Notes (Signed)
FOLLOW UP  ? ?Encounter Diagnosis  ?Name Primary?  ? Effusion, right knee Yes  ? ? ? ?Chief Complaint  ?Patient presents with  ? Knee Pain  ?  Right, Recurrent no injury, tried [facility] brace with no relief  ? ? ? ?Daniel Mercado was last seen on 16 February.  Back then he was having some right knee pain he injured his knee while putting something in his refrigerator he had trouble weightbearing with severe pain but by the time he saw's it was better and we recommended an economy hinged brace ? ?His x-ray showed right knee chondrocalcinosis with arthritis ? ?He was not able to wear the economy hinged brace and wore something that he got from [place] ? ?He presents today with right knee pain and swelling and painful weightbearing ? ?His exam shows a large effusion with decreased range of motion his motor exam is normal there is not seem to be sign of infection there were no neurovascular deficits ? ?I aspirated and injected his right knee aspirated out 70 cc of clear yellow fluid put him on steroids he does have a history of gout ? ?I will call him on Friday when I get the results ? ?I asked him to wear a 6 inch Ace and ice the knee for 3 times a day ? ?Encounter Diagnosis  ?Name Primary?  ? Effusion, right knee Yes  ? ?Meds ordered this encounter  ?Medications  ? predniSONE (DELTASONE) 10 MG tablet  ?  Sig: Take 1 tablet (10 mg total) by mouth 2 (two) times daily with a meal for 14 days.  ?  Dispense:  28 tablet  ?  Refill:  0  ?  ? ?Aspiration injection right knee ? ?Procedure note injection and aspiration right knee joint ? ?Verbal consent was obtained to aspirate and inject the right knee joint  ? ?Timeout was completed to confirm the site of aspiration and injection ? ?An 18-gauge needle was used to aspirate the knee joint from a suprapatellar lateral approach. ? ?The medications used were 40 mg of Depo-Medrol and 1% lidocaine 3 cc ? ?Anesthesia was provided by ethyl chloride and the skin was prepped with  alcohol. ? ?After cleaning the skin with alcohol an 18-gauge needle was used to aspirate the right knee joint. ? ?We obtained 17 cc of fluid clear yellow ? ?We follow this by injection of 40 mg of Depo-Medrol and 3 cc 1% lidocaine. ? ?There were no complications. A sterile bandage was applied. ?  ?

## 2021-04-07 LAB — SYNOVIAL FLUID ANALYSIS, COMPLETE
Basophils, %: 0 %
Eosinophils-Synovial: 0 % (ref 0–2)
Lymphocytes-Synovial Fld: 63 % (ref 0–74)
Monocyte/Macrophage: 11 % (ref 0–69)
Neutrophil, Synovial: 25 % — ABNORMAL HIGH (ref 0–24)
Synoviocytes, %: 1 % (ref 0–15)
WBC, Synovial: 453 cells/uL — ABNORMAL HIGH (ref <150)

## 2021-04-08 ENCOUNTER — Telehealth: Payer: Self-pay | Admitting: Orthopedic Surgery

## 2021-04-08 NOTE — Telephone Encounter (Signed)
I called Daniel Mercado had to talk to his wife and with his permission ? ?He has monosodium urate crystals in his knee and was treated with injection and prednisone ?

## 2021-04-11 ENCOUNTER — Ambulatory Visit: Payer: Medicare Other | Admitting: Orthopedic Surgery

## 2021-04-19 LAB — HM DIABETES EYE EXAM

## 2021-05-02 ENCOUNTER — Encounter: Payer: Self-pay | Admitting: Internal Medicine

## 2021-05-02 ENCOUNTER — Ambulatory Visit (INDEPENDENT_AMBULATORY_CARE_PROVIDER_SITE_OTHER): Payer: Medicare Other | Admitting: Internal Medicine

## 2021-05-02 VITALS — BP 132/78 | HR 69 | Resp 18 | Ht 72.0 in | Wt 194.2 lb

## 2021-05-02 DIAGNOSIS — I10 Essential (primary) hypertension: Secondary | ICD-10-CM

## 2021-05-02 DIAGNOSIS — H9193 Unspecified hearing loss, bilateral: Secondary | ICD-10-CM | POA: Diagnosis not present

## 2021-05-02 DIAGNOSIS — F02A0 Dementia in other diseases classified elsewhere, mild, without behavioral disturbance, psychotic disturbance, mood disturbance, and anxiety: Secondary | ICD-10-CM

## 2021-05-02 DIAGNOSIS — E1169 Type 2 diabetes mellitus with other specified complication: Secondary | ICD-10-CM

## 2021-05-02 DIAGNOSIS — G309 Alzheimer's disease, unspecified: Secondary | ICD-10-CM

## 2021-05-02 DIAGNOSIS — E039 Hypothyroidism, unspecified: Secondary | ICD-10-CM | POA: Diagnosis not present

## 2021-05-02 DIAGNOSIS — N400 Enlarged prostate without lower urinary tract symptoms: Secondary | ICD-10-CM | POA: Diagnosis not present

## 2021-05-02 LAB — POCT GLYCOSYLATED HEMOGLOBIN (HGB A1C)
HbA1c, POC (controlled diabetic range): 6.7 % (ref 0.0–7.0)
HbA1c, POC (prediabetic range): 6.7 % — AB (ref 5.7–6.4)

## 2021-05-02 MED ORDER — DONEPEZIL HCL 5 MG PO TABS: 5.0000 mg | ORAL_TABLET | Freq: Every day | ORAL | 1 refills | Status: DC

## 2021-05-02 NOTE — Patient Instructions (Signed)
Please start taking Donepezil as prescribed for dementia. ? ?Please continue taking other medications as prescribed. ? ?Please continue to follow low carb diet and ambulate as tolerated. ? ?Please wear hearing aid regularly. ?

## 2021-05-02 NOTE — Progress Notes (Signed)
? ?Established Patient Office Visit ? ?Subjective:  ?Patient ID: Daniel Mercado, male    DOB: 04-26-1931  Age: 88 y.o. MRN: [id number] ? ?CC:  ?Chief Complaint  ?Patient presents with  ? Follow-up  ?  4 month follow up pt would like to discuss medication for dementia feels like he is forgetting things  ? ? ?HPI ?Daniel Mercado is a 88 y.o. male with past medical history of DM, BPH, DDD of lumbar spine s/p disc repair and gout who presents for f/u of his chronic medical conditions. ? ?He is concerned about short-term memory loss.  He has been having more word finding difficulty and has difficulty recalling names of the people at times, which makes him anxious.  Of note, he has chronic hearing loss, but does not use hearing aid regularly. ? ?BP is well-controlled. Takes medications regularly. Patient denies headache, dizziness, chest pain, dyspnea or palpitations. ?  ?His HbA1C was 6.7. He used to take Glipizide, which was stopped in the last visit to avoid hypoglycemia risk. Denies any polyuria or polyphagia. Does not recall trying any other medication for it. ? ?Past Medical History:  ?Diagnosis Date  ? Erectile dysfunction   ? Gout   ? History of colon polyps   ? Left ureteral stone   ? Nocturia   ? Peripheral neuropathy   ? Sciatica   ? Type 2 diabetes mellitus ([place])   ? ? ?Past Surgical History:  ?Procedure Laterality Date  ? COLONOSCOPY  last one 2009  ? [place]  ? ? ?Family History  ?Problem Relation Age of Onset  ? Diabetes Other   ? ? ?Social History  ? ?Socioeconomic History  ? Marital status: Married  ?  Spouse name: Not on file  ? Number of children: Not on file  ? Years of education: Not on file  ? Highest education level: Not on file  ?Occupational History  ? Not on file  ?Tobacco Use  ? Smoking status: Former  ? Smokeless tobacco: Current  ?  Types: Chew  ?Substance and Sexual Activity  ? Alcohol use: No  ? Drug use: No  ? Sexual activity: Not on file  ?Other Topics Concern  ? Not  on file  ?Social History Narrative  ? Not on file  ? ?Social Determinants of Health  ? ?Financial Resource Strain: Low Risk   ? Difficulty of Paying Living Expenses: Not hard at all  ?Food Insecurity: No Food Insecurity  ? Worried About Charity fundraiser in the Last Year: Never true  ? Ran Out of Food in the Last Year: Never true  ?Transportation Needs: No Transportation Needs  ? Lack of Transportation (Medical): No  ? Lack of Transportation (Non-Medical): No  ?Physical Activity: Sufficiently Active  ? Days of Exercise per Week: 5 days  ? Minutes of Exercise per Session: 30 min  ?Stress: No Stress Concern Present  ? Feeling of Stress : Not at all  ?Social Connections: Moderately Integrated  ? Frequency of Communication with Friends and Family: More than three times a week  ? Frequency of Social Gatherings with Friends and Family: More than three times a week  ? Attends Religious Services: More than 4 times per year  ? Active Member of Clubs or Organizations: No  ? Attends Archivist Meetings: Never  ? Marital Status: Married  ?Intimate Partner Violence: Not At Risk  ? Fear of Current or Ex-Partner: No  ?  Emotionally Abused: No  ? Physically Abused: No  ? Sexually Abused: No  ? ? ?Outpatient Medications Prior to Visit  ?Medication Sig Dispense Refill  ? allopurinol (ZYLOPRIM) 100 MG tablet Take 1 tablet (100 mg total) by mouth daily. 90 tablet 1  ? aspirin 81 MG tablet Take 81 mg by mouth daily.    ? docusate sodium (COLACE) 100 MG capsule Take 1 capsule (100 mg total) by mouth daily as needed for mild constipation. 30 capsule 5  ? levothyroxine (SYNTHROID) 75 MCG tablet Take 1 tablet by mouth once daily 90 tablet 0  ? lisinopril (ZESTRIL) 5 MG tablet Take 1 tablet by mouth once daily 90 tablet 0  ? polyethylene glycol powder (GLYCOLAX/MIRALAX) 17 GM/SCOOP powder Take 17 g by mouth daily as needed for mild constipation or moderate constipation.     ? tamsulosin (FLOMAX) 0.4 MG CAPS capsule Take 2 capsules  by mouth once daily 180 capsule 0  ? ?No facility-administered medications prior to visit.  ? ? ?No Known Allergies ? ?ROS ?Review of Systems  ?Constitutional:  Negative for chills and fever.  ?HENT:  Negative for congestion and sore throat.   ?Eyes:  Negative for pain and discharge.  ?Respiratory:  Negative for cough and shortness of breath.   ?Cardiovascular:  Negative for chest pain and palpitations.  ?Gastrointestinal:  Negative for diarrhea, nausea and vomiting.  ?Endocrine: Negative for polydipsia and polyuria.  ?Genitourinary:  Negative for dysuria and hematuria.  ?Musculoskeletal:  Negative for neck pain and neck stiffness.  ?Skin:  Negative for rash.  ?Neurological:  Negative for dizziness, weakness, numbness and headaches.  ?Psychiatric/Behavioral:  Negative for agitation and behavioral problems. The patient is nervous/anxious.   ? ?  ?Objective:  ?  ?Physical Exam ?Vitals reviewed.  ?Constitutional:   ?   General: He is not in acute distress. ?   Appearance: He is not diaphoretic.  ?HENT:  ?   Head: Normocephalic and atraumatic.  ?   Nose: Nose normal.  ?   Mouth/Throat:  ?   Mouth: Mucous membranes are moist.  ?Eyes:  ?   General: No scleral icterus. ?   Extraocular Movements: Extraocular movements intact.  ?Cardiovascular:  ?   Rate and Rhythm: Normal rate and regular rhythm.  ?   Pulses: Normal pulses.  ?   Heart sounds: Normal heart sounds. No murmur heard. ?Pulmonary:  ?   Breath sounds: Normal breath sounds. No wheezing or rales.  ?Abdominal:  ?   Palpations: Abdomen is soft.  ?   Tenderness: There is no abdominal tenderness.  ?Musculoskeletal:  ?   Cervical back: Neck supple. No tenderness.  ?   Right lower leg: No edema.  ?   Left lower leg: No edema.  ?Skin: ?   General: Skin is warm.  ?   Findings: Rash (Seborrheic keratosis on forearms - papular rash) present.  ?Neurological:  ?   General: No focal deficit present.  ?   Mental Status: He is alert and oriented to person, place, and time.  ?    Comments: MMSE: 26  ?Psychiatric:     ?   Mood and Affect: Mood normal.     ?   Behavior: Behavior normal.  ? ? ?BP 132/78 (BP Location: Left Arm, Patient Position: Sitting, Cuff Size: Normal)   Pulse 69   Resp 18   Ht 6' (1.829 m)   Wt 194 lb 3.2 oz (88.1 kg)   SpO2 98%   BMI 26.34  kg/m?  ?Wt Readings from Last 3 Encounters:  ?05/02/21 194 lb 3.2 oz (88.1 kg)  ?03/03/21 200 lb (90.7 kg)  ?12/31/20 207 lb 0.6 oz (93.9 kg)  ? ? ?Lab Results  ?Component Value Date  ? TSH 3.050 12/27/2020  ? ?Lab Results  ?Component Value Date  ? WBC 6.0 12/27/2020  ? HGB 14.4 12/27/2020  ? HCT 41.2 12/27/2020  ? MCV 92 12/27/2020  ? PLT 182 12/27/2020  ? ?Lab Results  ?Component Value Date  ? NA 142 12/27/2020  ? K 5.1 12/27/2020  ? CO2 24 12/27/2020  ? GLUCOSE 100 (H) 12/27/2020  ? BUN 13 12/27/2020  ? CREATININE 1.20 12/27/2020  ? BILITOT 0.6 12/27/2020  ? ALKPHOS 59 12/27/2020  ? AST 21 12/27/2020  ? ALT 10 12/27/2020  ? PROT 6.4 12/27/2020  ? ALBUMIN 4.0 12/27/2020  ? CALCIUM 9.4 12/27/2020  ? ANIONGAP 9 10/09/2014  ? EGFR 59 (L) 12/27/2020  ? ?Lab Results  ?Component Value Date  ? CHOL 162 12/27/2020  ? ?Lab Results  ?Component Value Date  ? HDL 42 12/27/2020  ? ?Lab Results  ?Component Value Date  ? LDLCALC 105 (H) 12/27/2020  ? ?Lab Results  ?Component Value Date  ? TRIG 81 12/27/2020  ? ?Lab Results  ?Component Value Date  ? CHOLHDL 3.9 12/27/2020  ? ?Lab Results  ?Component Value Date  ? HGBA1C 6.7 (A) 05/02/2021  ? HGBA1C 6.7 05/02/2021  ? ? ?  ?Assessment & Plan:  ? ?Problem List Items Addressed This Visit   ? ?  ? Cardiovascular and Mediastinum  ? Essential hypertension, benign  ?  BP Readings from Last 1 Encounters:  ?05/02/21 132/78  ?Well-controlled with Lisinopril ?Counseled for compliance with the medications ?Advised DASH diet and moderate exercise/walking as tolerated ?  ?  ?  ? Endocrine  ? Type 2 diabetes mellitus with other specified complication ([place]) - Primary  ?  Lab Results  ?Component Value Date  ?  HGBA1C 6.7 (A) 05/02/2021  ? HGBA1C 6.7 05/02/2021  ? ?Diet controlled currently ?DC glipizide in the last visit to avoid hypoglycemia ?Will check HbA1C in the next visit and if needed, will start Metformin ?A

## 2021-05-06 NOTE — Assessment & Plan Note (Addendum)
MMSE: 26 ?Has mild cognitive decline with short-term memory loss ?Started donepezil 5 mg qHS ?Advised to wear hearing aids as hearing loss can impair cognition as well ? ?

## 2021-05-06 NOTE — Assessment & Plan Note (Signed)
Needs to use hearing aid regularly ?

## 2021-05-06 NOTE — Assessment & Plan Note (Signed)
BP Readings from Last 1 Encounters:  ?05/02/21 132/78  ? ?Well-controlled with Lisinopril ?Counseled for compliance with the medications ?Advised DASH diet and moderate exercise/walking as tolerated ?

## 2021-05-06 NOTE — Assessment & Plan Note (Signed)
Lab Results  ?Component Value Date  ? TSH 3.050 12/27/2020  ? ?On Levothyroxine ?Reviewed TSH and free T4 ?

## 2021-05-06 NOTE — Assessment & Plan Note (Signed)
Lab Results  ?Component Value Date  ? HGBA1C 6.7 (A) 05/02/2021  ? HGBA1C 6.7 05/02/2021  ? ? ?Diet controlled currently ?DC glipizide in the last visit to avoid hypoglycemia ?Will check HbA1C in the next visit and if needed, will start Metformin ?Advised to follow diabetic diet ?On statin and ACEi ?Reviewed CMP and lipid panel ?Diabetic eye exam: Advised to follow up with Ophthalmology for diabetic eye exam ?

## 2021-05-06 NOTE — Assessment & Plan Note (Signed)
Well controlled currently with tamsulosin 

## 2021-05-20 ENCOUNTER — Encounter (HOSPITAL_COMMUNITY): Payer: Self-pay

## 2021-05-20 ENCOUNTER — Emergency Department (HOSPITAL_COMMUNITY): Payer: Medicare Other

## 2021-05-20 ENCOUNTER — Emergency Department (HOSPITAL_COMMUNITY)
Admission: EM | Admit: 2021-05-20 | Discharge: 2021-05-21 | Disposition: A | Discharge status: Home / Self Care | Payer: Medicare Other | Attending: Emergency Medicine | Admitting: Emergency Medicine

## 2021-05-20 ENCOUNTER — Other Ambulatory Visit: Payer: Self-pay

## 2021-05-20 DIAGNOSIS — Z79899 Other long term (current) drug therapy: Secondary | ICD-10-CM | POA: Diagnosis not present

## 2021-05-20 DIAGNOSIS — E039 Hypothyroidism, unspecified: Secondary | ICD-10-CM | POA: Insufficient documentation

## 2021-05-20 DIAGNOSIS — E860 Dehydration: Secondary | ICD-10-CM | POA: Diagnosis not present

## 2021-05-20 DIAGNOSIS — R1030 Lower abdominal pain, unspecified: Secondary | ICD-10-CM | POA: Insufficient documentation

## 2021-05-20 DIAGNOSIS — Z7982 Long term (current) use of aspirin: Secondary | ICD-10-CM | POA: Insufficient documentation

## 2021-05-20 DIAGNOSIS — R531 Weakness: Secondary | ICD-10-CM | POA: Diagnosis not present

## 2021-05-20 DIAGNOSIS — I10 Essential (primary) hypertension: Secondary | ICD-10-CM | POA: Insufficient documentation

## 2021-05-20 DIAGNOSIS — E119 Type 2 diabetes mellitus without complications: Secondary | ICD-10-CM | POA: Diagnosis not present

## 2021-05-20 DIAGNOSIS — A419 Sepsis, unspecified organism: Secondary | ICD-10-CM | POA: Diagnosis not present

## 2021-05-20 DIAGNOSIS — R000 Tachycardia, unspecified: Secondary | ICD-10-CM | POA: Diagnosis not present

## 2021-05-20 DIAGNOSIS — Z0389 Encounter for observation for other suspected diseases and conditions ruled out: Secondary | ICD-10-CM | POA: Diagnosis not present

## 2021-05-20 DIAGNOSIS — Z20822 Contact with and (suspected) exposure to covid-19: Secondary | ICD-10-CM | POA: Insufficient documentation

## 2021-05-20 LAB — COMPREHENSIVE METABOLIC PANEL
ALT: 15 U/L (ref 0–44)
AST: 20 U/L (ref 15–41)
Albumin: 3.5 g/dL (ref 3.5–5.0)
Alkaline Phosphatase: 54 U/L (ref 38–126)
Anion gap: 7 (ref 5–15)
BUN: 19 mg/dL (ref 8–23)
CO2: 24 mmol/L (ref 22–32)
Calcium: 8.9 mg/dL (ref 8.9–10.3)
Chloride: 107 mmol/L (ref 98–111)
Creatinine, Ser: 1.24 mg/dL (ref 0.61–1.24)
GFR, Estimated: 56 mL/min — ABNORMAL LOW (ref >60)
Glucose, Bld: 144 mg/dL — ABNORMAL HIGH (ref 70–99)
Potassium: 3.8 mmol/L (ref 3.5–5.1)
Sodium: 138 mmol/L (ref 135–145)
Total Bilirubin: 0.5 mg/dL (ref 0.3–1.2)
Total Protein: 6.8 g/dL (ref 6.5–8.1)

## 2021-05-20 LAB — CBC WITH DIFFERENTIAL/PLATELET
Abs Immature Granulocytes: 0.02 10*3/uL (ref 0.00–0.07)
Basophils Absolute: 0 10*3/uL (ref 0.0–0.1)
Basophils Relative: 1 %
Eosinophils Absolute: 0.1 10*3/uL (ref 0.0–0.5)
Eosinophils Relative: 1 %
HCT: 38.6 % — ABNORMAL LOW (ref 39.0–52.0)
Hemoglobin: 13 g/dL (ref 13.0–17.0)
Immature Granulocytes: 0 %
Lymphocytes Relative: 23 %
Lymphs Abs: 1.5 10*3/uL (ref 0.7–4.0)
MCH: 31.9 pg (ref 26.0–34.0)
MCHC: 33.7 g/dL (ref 30.0–36.0)
MCV: 94.8 fL (ref 80.0–100.0)
Monocytes Absolute: 0.7 10*3/uL (ref 0.1–1.0)
Monocytes Relative: 10 %
Neutro Abs: 4.5 10*3/uL (ref 1.7–7.7)
Neutrophils Relative %: 65 %
Platelets: 183 10*3/uL (ref 150–400)
RBC: 4.07 MIL/uL — ABNORMAL LOW (ref 4.22–5.81)
RDW: 14.2 % (ref 11.5–15.5)
WBC: 6.8 10*3/uL (ref 4.0–10.5)
nRBC: 0 % (ref 0.0–0.2)

## 2021-05-20 LAB — PROTIME-INR
INR: 1.1 (ref 0.8–1.2)
Prothrombin Time: 14.2 seconds (ref 11.4–15.2)

## 2021-05-20 LAB — LACTIC ACID, PLASMA
Lactic Acid, Venous: 0.9 mmol/L (ref 0.5–1.9)
Lactic Acid, Venous: 1 mmol/L (ref 0.5–1.9)

## 2021-05-20 LAB — RESP PANEL BY RT-PCR (FLU A&B, COVID) ARPGX2
Influenza A by PCR: NEGATIVE
Influenza B by PCR: NEGATIVE
SARS Coronavirus 2 by RT PCR: NEGATIVE

## 2021-05-20 LAB — APTT: aPTT: 33 seconds (ref 24–36)

## 2021-05-20 LAB — TROPONIN I (HIGH SENSITIVITY): Troponin I (High Sensitivity): 6 ng/L (ref <18)

## 2021-05-20 MED ORDER — SODIUM CHLORIDE 0.9 % IV BOLUS (SEPSIS)
1000.0000 mL | Freq: Once | INTRAVENOUS | Status: AC
  Administered 2021-05-20: 1000 mL via INTRAVENOUS

## 2021-05-20 MED ORDER — LACTATED RINGERS IV SOLN: INTRAVENOUS | Status: DC

## 2021-05-20 MED ORDER — SODIUM CHLORIDE 0.9 % IV SOLN
2.0000 g | INTRAVENOUS | Status: DC
  Administered 2021-05-20: 2 g via INTRAVENOUS
  Filled 2021-05-20: qty 20

## 2021-05-20 NOTE — Sepsis Progress Note (Signed)
Sepsis protocol is being followed by [facility]. 

## 2021-05-20 NOTE — ED Provider Notes (Signed)
?  Provider Note ?MRN:  [id number]  ?Arrival date & time: 05/21/21    ?ED Course and Medical Decision Making  ?Assumed care from Dr. Sabra Heck at shift change. ? ?Malaise, fatigue, frequent urination, awaiting urinalysis.  Suspect UTI, would be a candidate for discharge on antibiotics. ? ?Work-up is reassuring with a normal-appearing urinalysis.  Upon further questioning, patient mowed is grass today and possibly became dehydrated.  About an acre of land that he mowed.  No emergent process, appropriate for discharge. ? ?Procedures ? ?Final Clinical Impressions(s) / ED Diagnoses  ? ?  ICD-10-CM   ?1. Dehydration  E86.0   ?  ?2. Weakness  R53.1   ?  ?  ?ED Discharge Orders   ? ? None  ? ?  ?  ? ? ?Discharge Instructions   ? ?  ?You were evaluated in the Emergency Department and after careful evaluation, we did not find any emergent condition requiring admission or further testing in the hospital. ? ?Your exam/testing today was overall reassuring.  Symptoms may be due to dehydration.  Recommend rest and fluids at home for the next few days. ? ?Please return to the Emergency Department if you experience any worsening of your condition.  Thank you for allowing [place] to be a part of your care. ? ? ? ? ? ? ?Barth Kirks. Sedonia Small, MD ?[facility] ?[email or web address] ? ?  ?Maudie Flakes, MD ?05/21/21 0112 ? ?

## 2021-05-20 NOTE — ED Triage Notes (Signed)
Pt c/o weakness that started today and having trouble walking. Also states he has been having urinary frequency.  ? ?Dr. Sabra Heck @ bedside during triage ?

## 2021-05-20 NOTE — ED Provider Notes (Signed)
?[facility] ?Provider Note ? ? ?CSN: [id number] ?Arrival date & time: 05/20/21  2015 ? ?  ? ?History ? ?Chief Complaint  ?Patient presents with  ? Weakness  ? ? ?Daniel Mercado is a 88 y.o. male. ? ? ?Weakness ? ?This patient is an 88 year old male, he has a history of hypothyroidism, hypertension on lisinopril and he is on Flomax.  He presents to the hospital today with a complaint of feeling generally weak, he reports that he was up all night urinating back and forth to the bathroom, feels like he cannot get much out but had the need to go.  He denies fevers or chills but was found to be tachycardic with a blood pressure of 100/60 by his daughter-in-law who lives next door to him.  The patient has no other symptoms including chest pain coughing or shortness of breath, nobody has been sick around him. ? ?Home Medications ?Prior to Admission medications   ?Medication Sig Start Date End Date Taking? Authorizing Provider  ?allopurinol (ZYLOPRIM) 100 MG tablet Take 1 tablet (100 mg total) by mouth daily. 01/04/21   Lindell Spar, MD  ?aspirin 81 MG tablet Take 81 mg by mouth daily.    [provider]  ?docusate sodium (COLACE) 100 MG capsule Take 1 capsule (100 mg total) by mouth daily as needed for mild constipation. 08/27/20   Lindell Spar, MD  ?donepezil (ARICEPT) 5 MG tablet Take 1 tablet (5 mg total) by mouth at bedtime. 05/02/21   Lindell Spar, MD  ?levothyroxine (SYNTHROID) 75 MCG tablet Take 1 tablet by mouth once daily 03/30/21   Lindell Spar, MD  ?lisinopril (ZESTRIL) 5 MG tablet Take 1 tablet by mouth once daily 03/30/21   Lindell Spar, MD  ?polyethylene glycol powder (GLYCOLAX/MIRALAX) 17 GM/SCOOP powder Take 17 g by mouth daily as needed for mild constipation or moderate constipation.     [provider]  ?tamsulosin (FLOMAX) 0.4 MG CAPS capsule Take 2 capsules by mouth once daily 02/21/21   Lindell Spar, MD  ?   ? ?Allergies    ?Patient has no known allergies.    ? ?Review of Systems   ?Review of Systems  ?Neurological:  Positive for weakness.  ?All other systems reviewed and are negative. ? ?Physical Exam ?Updated Vital Signs ?BP 134/73   Pulse (!) 128   Temp 98.7 ?F (37.1 ?C) (Oral)   Resp 18   Ht 1.829 m (6')   Wt 87 kg   SpO2 94%   BMI 26.01 kg/m?  ?Physical Exam ?Vitals and nursing note reviewed.  ?Constitutional:   ?   General: He is not in acute distress. ?   Appearance: He is well-developed.  ?HENT:  ?   Head: Normocephalic and atraumatic.  ?   Mouth/Throat:  ?   Pharynx: No oropharyngeal exudate.  ?Eyes:  ?   General: No scleral icterus.    ?   Right eye: No discharge.     ?   Left eye: No discharge.  ?   Conjunctiva/sclera: Conjunctivae normal.  ?   Pupils: Pupils are equal, round, and reactive to light.  ?Neck:  ?   Thyroid: No thyromegaly.  ?   Vascular: No JVD.  ?Cardiovascular:  ?   Rate and Rhythm: Regular rhythm. Tachycardia present.  ?   Heart sounds: Normal heart sounds. No murmur heard. ?  No friction rub. No gallop.  ?Pulmonary:  ?   Effort: Pulmonary effort  is normal. No respiratory distress.  ?   Breath sounds: Normal breath sounds. No wheezing or rales.  ?Abdominal:  ?   General: Bowel sounds are normal. There is no distension.  ?   Palpations: Abdomen is soft. There is no mass.  ?   Tenderness: There is abdominal tenderness.  ?   Comments: Minimal lower abdominal tenderness, nonfocal, no guarding or peritoneal signs, no upper abdominal or right upper quadrant tenderness  ?Musculoskeletal:     ?   General: No tenderness. Normal range of motion.  ?   Cervical back: Normal range of motion and neck supple.  ?   Right lower leg: No edema.  ?   Left lower leg: No edema.  ?Lymphadenopathy:  ?   Cervical: No cervical adenopathy.  ?Skin: ?   General: Skin is warm and dry.  ?   Findings: No erythema or rash.  ?Neurological:  ?   Mental Status: He is alert.  ?   Coordination: Coordination normal.  ?Psychiatric:     ?   Behavior: Behavior normal.   ? ? ?ED Results / Procedures / Treatments   ?Labs ?(all labs ordered are listed, but only abnormal results are displayed) ?Labs Reviewed - No data to display ? ?EKG ?None ? ?Radiology ?No results found. ? ?Procedures ?Procedures  ? ? ?Medications Ordered in ED ?Medications  ?lactated ringers infusion (has no administration in time range)  ?sodium chloride 0.9 % bolus 1,000 mL (has no administration in time range)  ?cefTRIAXone (ROCEPHIN) 2 g in sodium chloride 0.9 % 100 mL IVPB (has no administration in time range)  ? ? ?ED Course/ Medical Decision Making/ A&P ?  ?                        ?Medical Decision Making ?Amount and/or Complexity of Data Reviewed ?Labs: ordered. ?Radiology: ordered. ?ECG/medicine tests: ordered. ? ?Risk ?Prescription drug management. ? ? ?This patient presents to the ED for concern of generalized weakness and borderline hypotension, this involves an extensive number of treatment options, and is a complaint that carries with it a high risk of complications and morbidity.  The differential diagnosis includes infection, renal failure, arrhythmia, cardiac problems ? ? ?Co morbidities that complicate the patient evaluation ? ?Elderly, hypertensive ? ? ?Additional history obtained: ? ?Additional history obtained from daughter in law who is at the bedside ?External records from outside source obtained and reviewed including prior medical records, the patient has not been admitted to the hospital over the last 6 years or so.  He is followed in the office by Dr. Wolfgang Phoenix for his diabetes, hypertension and hypothyroidism. ? ? ?Lab Tests: ? ?I Ordered, and personally interpreted labs.  The pertinent results include:  CBC metabolic panel totally unremarkable, at the time of change of shift the urinalysis is pending.  The lactic acid was normal. ? ? ?Imaging Studies ordered: ? ?I ordered imaging studies including chest x-ray which shows ?I independently visualized and interpreted imaging which showed no  acute findings, no infiltrates, no pneumothorax. ?I agree with the radiologist interpretation ? ? ?Cardiac Monitoring: / EKG: ? ?The patient was maintained on a cardiac monitor.  I personally viewed and interpreted the cardiac monitored which showed an underlying rhythm of: No ischemia or significant arrhythmia ? ? ?Consultations Obtained: ? ?At change of shift care signed out to oncoming emergency department physician Dr. Sedonia Small to follow-up results and disposition accordingly ? ? ?Social Determinants of Health: ? ?  Elderly ? ? ?Test / Admission - Considered: ? ?The patient may need to be admitted to the hospital if there is significant findings that would require inpatient treatment however at this time his vital signs are unremarkable, he is overall well-appearing and does not have any focal neurologic deficits. ? ?Dr. Sedonia Small to follow and disposition ? ? ? ? ? ? ? ? ?Final Clinical Impression(s) / ED Diagnoses ?Final diagnoses:  ?None  ? ? ?Rx / DC Orders ?ED Discharge Orders   ? ? None  ? ?  ? ? ?  ?Noemi Chapel, MD ?05/21/21 1456 ? ?

## 2021-05-21 DIAGNOSIS — R531 Weakness: Secondary | ICD-10-CM | POA: Diagnosis not present

## 2021-05-21 LAB — URINALYSIS, ROUTINE W REFLEX MICROSCOPIC
Bacteria, UA: NONE SEEN
Bilirubin Urine: NEGATIVE
Glucose, UA: NEGATIVE mg/dL
Ketones, ur: NEGATIVE mg/dL
Leukocytes,Ua: NEGATIVE
Nitrite: NEGATIVE
Protein, ur: NEGATIVE mg/dL
Specific Gravity, Urine: 1.008 (ref 1.005–1.030)
pH: 5 (ref 5.0–8.0)

## 2021-05-21 NOTE — Discharge Instructions (Signed)
You were evaluated in the Emergency Department and after careful evaluation, we did not find any emergent condition requiring admission or further testing in the hospital. ? ?Your exam/testing today was overall reassuring.  Symptoms may be due to dehydration.  Recommend rest and fluids at home for the next few days. ? ?Please return to the Emergency Department if you experience any worsening of your condition.  Thank you for allowing [place] to be a part of your care. ? ?

## 2021-05-22 LAB — URINE CULTURE: Culture: NO GROWTH

## 2021-05-25 LAB — CULTURE, BLOOD (ROUTINE X 2)
Culture: NO GROWTH
Culture: NO GROWTH

## 2021-06-10 ENCOUNTER — Other Ambulatory Visit: Payer: Self-pay | Admitting: Internal Medicine

## 2021-06-10 DIAGNOSIS — N400 Enlarged prostate without lower urinary tract symptoms: Secondary | ICD-10-CM

## 2021-06-21 ENCOUNTER — Other Ambulatory Visit: Payer: Self-pay | Admitting: Internal Medicine

## 2021-06-21 DIAGNOSIS — E038 Other specified hypothyroidism: Secondary | ICD-10-CM

## 2021-08-19 ENCOUNTER — Other Ambulatory Visit: Payer: Self-pay | Admitting: *Deleted

## 2021-08-19 NOTE — Patient Outreach (Signed)
  Care Coordination   08/19/2021 Name: Daniel Mercado MRN: [id number] DOB: August 29, 1931   Care Coordination Outreach Attempts:  An unsuccessful telephone outreach was attempted today to offer the patient information about available care coordination services as a benefit of their health plan.   Follow Up Plan:  Additional outreach attempts will be made to offer the patient care coordination information and services.   Encounter Outcome:  No Answer  Care Coordination Interventions Activated:  No   Care Coordination Interventions:  No, not indicated    SIG Alfonzia Woolum C. Myrtie Neither, MSN, [facility] Gerontological Nurse Practitioner [facility] Care Management [phone]

## 2021-08-22 NOTE — Patient Outreach (Signed)
  Care Coordination   08/22/2021 Name: Daniel Mercado MRN: [id number] DOB: Aug 21, 1931   Care Coordination Outreach Attempts:  An unsuccessful telephone outreach was attempted today to offer the patient information about available care coordination services as a benefit of their health plan.   Follow Up Plan:  Additional outreach attempts will be made to offer the patient care coordination information and services.   Encounter Outcome:  No Answer  Care Coordination Interventions Activated:  No   Care Coordination Interventions:  No, not indicated    SIG Dilon Lank C. Myrtie Neither, MSN, [facility] Gerontological Nurse Practitioner [facility] Care Management [phone]

## 2021-09-01 ENCOUNTER — Ambulatory Visit (INDEPENDENT_AMBULATORY_CARE_PROVIDER_SITE_OTHER): Payer: Medicare Other | Admitting: Internal Medicine

## 2021-09-01 ENCOUNTER — Encounter: Payer: Self-pay | Admitting: Internal Medicine

## 2021-09-01 VITALS — BP 126/84 | HR 60 | Resp 18 | Ht 72.0 in | Wt 197.2 lb

## 2021-09-01 DIAGNOSIS — F02A0 Dementia in other diseases classified elsewhere, mild, without behavioral disturbance, psychotic disturbance, mood disturbance, and anxiety: Secondary | ICD-10-CM

## 2021-09-01 DIAGNOSIS — Z23 Encounter for immunization: Secondary | ICD-10-CM

## 2021-09-01 DIAGNOSIS — K5904 Chronic idiopathic constipation: Secondary | ICD-10-CM

## 2021-09-01 DIAGNOSIS — I10 Essential (primary) hypertension: Secondary | ICD-10-CM

## 2021-09-01 DIAGNOSIS — E1169 Type 2 diabetes mellitus with other specified complication: Secondary | ICD-10-CM | POA: Diagnosis not present

## 2021-09-01 DIAGNOSIS — N401 Enlarged prostate with lower urinary tract symptoms: Secondary | ICD-10-CM

## 2021-09-01 DIAGNOSIS — M1000 Idiopathic gout, unspecified site: Secondary | ICD-10-CM

## 2021-09-01 DIAGNOSIS — G309 Alzheimer's disease, unspecified: Secondary | ICD-10-CM

## 2021-09-01 DIAGNOSIS — N1831 Chronic kidney disease, stage 3a: Secondary | ICD-10-CM | POA: Insufficient documentation

## 2021-09-01 DIAGNOSIS — E782 Mixed hyperlipidemia: Secondary | ICD-10-CM | POA: Diagnosis not present

## 2021-09-01 DIAGNOSIS — R351 Nocturia: Secondary | ICD-10-CM

## 2021-09-01 NOTE — Assessment & Plan Note (Signed)
Advised to try Colace as needed Avoid MiraLAX for now as she has loose BM or stool urgency

## 2021-09-01 NOTE — Assessment & Plan Note (Signed)
BP Readings from Last 1 Encounters:  09/01/21 126/84   Well-controlled with Lisinopril Counseled for compliance with the medications Advised DASH diet and moderate exercise/walking as tolerated

## 2021-09-01 NOTE — Assessment & Plan Note (Signed)
Last BMP reviewed Maintain adequate hydration Avoid nephrotoxic agents 

## 2021-09-01 NOTE — Patient Instructions (Signed)
Please take Colace 100 mg once or twice daily as needed for constipation instead of Miralax.  Please continue taking medications as prescribed.  Please consider getting Shingrix and Tdap vaccines at local pharmacy.  Please get fasting blood tests before the next visit.

## 2021-09-01 NOTE — Assessment & Plan Note (Signed)
Lab Results  Component Value Date   LABURIC 5.2 12/27/2020   On allopurinol

## 2021-09-01 NOTE — Progress Notes (Signed)
Established Patient Office Visit  Subjective:  Patient ID: Daniel Mercado, male    DOB: [birth year]  Age: 88 y.o. MRN: [id number]  CC:  Chief Complaint  Patient presents with   Follow-up    Follow up HTN and Dementia    HPI Daniel Mercado is a 88 y.o. male with past medical history of DM, BPH, DDD of lumbar spine s/p disc repair and gout who presents for f/u of her chronic medical conditions.  His son is present during the visit today.  Dementia: He was started on donepezil in the last visit for dementia.  He has not had any episodes of agitation, delusion or hallucinations.  He has a hearing aid now.  BP is well-controlled. Takes medications regularly. Patient denies headache, dizziness, chest pain, dyspnea or palpitations.   His HbA1C was 6.7. He used to take Glipizide, which was stopped to avoid hypoglycemia risk. Denies any polyuria or polyphagia. Does not recall trying any other medication for it.  Constipation: He takes MiraLAX as needed, but has stool urgency or loose BM with it.  Denies any melena or hematochezia currently.       Past Medical History:  Diagnosis Date   Erectile dysfunction    Gout    History of colon polyps    Left ureteral stone    Nocturia    Peripheral neuropathy    Sciatica    Type 2 diabetes mellitus ([place])     Past Surgical History:  Procedure Laterality Date   COLONOSCOPY  last one 2009   [place]    Family History  Problem Relation Age of Onset   Diabetes Other     Social History   Socioeconomic History   Marital status: Married    Spouse name: Not on file   Number of children: Not on file   Years of education: Not on file   Highest education level: Not on file  Occupational History   Not on file  Tobacco Use   Smoking status: Former   Smokeless tobacco: Current    Types: Chew  Substance and Sexual Activity   Alcohol use: No   Drug use: No   Sexual activity: Not on file  Other Topics Concern    Not on file  Social History Narrative   Not on file   Social Determinants of Health   Financial Resource Strain: Low Risk  (09/19/2020)   Overall Financial Resource Strain (CARDIA)    Difficulty of Paying Living Expenses: Not hard at all  Food Insecurity: No Food Insecurity (09/19/2020)   Hunger Vital Sign    Worried About Running Out of Food in the Last Year: Never true    [place] in the Last Year: Never true  Transportation Needs: No Transportation Needs (09/19/2020)   PRAPARE - Hydrologist (Medical): No    Lack of Transportation (Non-Medical): No  Physical Activity: Sufficiently Active (09/19/2020)   Exercise Vital Sign    Days of Exercise per Week: 5 days    Minutes of Exercise per Session: 30 min  Stress: No Stress Concern Present (09/19/2020)   [place]    Feeling of Stress : Not at all  Social Connections: Moderately Integrated (09/19/2020)   Social Connection and Isolation Panel [NHANES]    Frequency of Communication with Friends and Family: More than three times a week  Frequency of Social Gatherings with Friends and Family: More than three times a week    Attends Religious Services: More than 4 times per year    Active Member of [employer] or Organizations: No    Attends Archivist Meetings: Never    Marital Status: Married  Human resources officer Violence: Not At Risk (09/19/2020)   Humiliation, Afraid, Rape, and Kick questionnaire    Fear of Current or Ex-Partner: No    Emotionally Abused: No    Physically Abused: No    Sexually Abused: No    Outpatient Medications Prior to Visit  Medication Sig Dispense Refill   allopurinol (ZYLOPRIM) 100 MG tablet Take 1 tablet by mouth once daily 90 tablet 0   aspirin 81 MG tablet Take 81 mg by mouth daily.     docusate sodium (COLACE) 100 MG capsule Take 1 capsule (100 mg total) by mouth daily as needed for mild constipation. 30  capsule 5   donepezil (ARICEPT) 5 MG tablet Take 1 tablet (5 mg total) by mouth at bedtime. 90 tablet 1   levothyroxine (SYNTHROID) 75 MCG tablet Take 1 tablet by mouth once daily 90 tablet 0   lisinopril (ZESTRIL) 5 MG tablet Take 1 tablet by mouth once daily 90 tablet 0   polyethylene glycol powder (GLYCOLAX/MIRALAX) 17 GM/SCOOP powder Take 17 g by mouth daily as needed for mild constipation or moderate constipation.      tamsulosin (FLOMAX) 0.4 MG CAPS capsule Take 2 capsules by mouth once daily 180 capsule 0   No facility-administered medications prior to visit.    No Known Allergies  ROS Review of Systems  Constitutional:  Negative for chills and fever.  HENT:  Negative for congestion and sore throat.   Eyes:  Negative for pain and discharge.  Respiratory:  Negative for cough and shortness of breath.   Cardiovascular:  Negative for chest pain and palpitations.  Gastrointestinal:  Positive for constipation. Negative for diarrhea, nausea and vomiting.  Endocrine: Negative for polydipsia and polyuria.  Genitourinary:  Negative for dysuria and hematuria.  Musculoskeletal:  Negative for neck pain and neck stiffness.  Skin:  Negative for rash.  Neurological:  Negative for dizziness, weakness, numbness and headaches.  Psychiatric/Behavioral:  Negative for agitation and behavioral problems. The patient is nervous/anxious.       Objective:    Physical Exam Vitals reviewed.  Constitutional:      General: He is not in acute distress.    Appearance: He is not diaphoretic.  HENT:     Head: Normocephalic and atraumatic.     Nose: Nose normal.     Mouth/Throat:     Mouth: Mucous membranes are moist.  Eyes:     General: No scleral icterus.    Extraocular Movements: Extraocular movements intact.  Cardiovascular:     Rate and Rhythm: Normal rate and regular rhythm.     Pulses: Normal pulses.     Heart sounds: Normal heart sounds. No murmur heard. Pulmonary:     Breath sounds:  Normal breath sounds. No wheezing or rales.  Musculoskeletal:     Cervical back: Neck supple. No tenderness.     Right lower leg: No edema.     Left lower leg: No edema.  Skin:    General: Skin is warm.     Findings: Rash (Seborrheic keratosis on forearms - papular rash) present.  Neurological:     General: No focal deficit present.     Mental Status: He is alert and  oriented to person, place, and time.     Comments: MMSE: 26  Psychiatric:        Mood and Affect: Mood normal.        Behavior: Behavior normal.     BP 126/84 (BP Location: Right Arm, Patient Position: Sitting, Cuff Size: Normal)   Pulse 60   Resp 18   Ht 6' (1.829 m)   Wt 197 lb 3.2 oz (89.4 kg)   SpO2 98%   BMI 26.75 kg/m  Wt Readings from Last 3 Encounters:  09/01/21 197 lb 3.2 oz (89.4 kg)  05/20/21 191 lb 12.8 oz (87 kg)  05/02/21 194 lb 3.2 oz (88.1 kg)    Lab Results  Component Value Date   TSH 3.050 12/27/2020   Lab Results  Component Value Date   WBC 6.8 05/20/2021   HGB 13.0 05/20/2021   HCT 38.6 (L) 05/20/2021   MCV 94.8 05/20/2021   PLT 183 05/20/2021   Lab Results  Component Value Date   NA 138 05/20/2021   K 3.8 05/20/2021   CO2 24 05/20/2021   GLUCOSE 144 (H) 05/20/2021   BUN 19 05/20/2021   CREATININE 1.24 05/20/2021   BILITOT 0.5 05/20/2021   ALKPHOS 54 05/20/2021   AST 20 05/20/2021   ALT 15 05/20/2021   PROT 6.8 05/20/2021   ALBUMIN 3.5 05/20/2021   CALCIUM 8.9 05/20/2021   ANIONGAP 7 05/20/2021   EGFR 59 (L) 12/27/2020   Lab Results  Component Value Date   CHOL 162 12/27/2020   Lab Results  Component Value Date   HDL 42 12/27/2020   Lab Results  Component Value Date   LDLCALC 105 (H) 12/27/2020   Lab Results  Component Value Date   TRIG 81 12/27/2020   Lab Results  Component Value Date   CHOLHDL 3.9 12/27/2020   Lab Results  Component Value Date   HGBA1C 6.7 (A) 05/02/2021   HGBA1C 6.7 05/02/2021      Assessment & Plan:   Problem List Items  Addressed This Visit       Cardiovascular and Mediastinum   Essential hypertension, benign    BP Readings from Last 1 Encounters:  09/01/21 126/84  Well-controlled with Lisinopril Counseled for compliance with the medications Advised DASH diet and moderate exercise/walking as tolerated      Relevant Orders   TSH   CBC with Differential/Platelet     Digestive   Chronic idiopathic constipation    Advised to try Colace as needed Avoid MiraLAX for now as she has loose BM or stool urgency        Endocrine   Type 2 diabetes mellitus with other specified complication ([place]) - Primary    Lab Results  Component Value Date   HGBA1C 6.7 (A) 05/02/2021   HGBA1C 6.7 05/02/2021   Diet controlled currently DC glipizide in the past to avoid hypoglycemia Advised to follow diabetic diet On statin and ACEi Reviewed CMP and lipid panel Diabetic eye exam: Advised to follow up with Ophthalmology for diabetic eye exam      Relevant Orders   Hemoglobin A1c     Nervous and Auditory   Mild Alzheimer's dementia without behavioral disturbance, psychotic disturbance, mood disturbance, or anxiety (HCC)    MMSE: 26 Has mild cognitive decline with short-term memory loss On donepezil 5 mg qHS Advised to wear hearing aids as hearing loss can impair cognition as well      Relevant Orders   TSH   CBC with  Differential/Platelet     Genitourinary   BPH (benign prostatic hyperplasia)    Well controlled currently with tamsulosin      Stage 3a chronic kidney disease ([place])    Last BMP reviewed Maintain adequate hydration Avoid nephrotoxic agents      Relevant Orders   VITAMIN D 25 Hydroxy (Vit-D Deficiency, Fractures)   CMP14+EGFR   CBC with Differential/Platelet     Other   Gout    Lab Results  Component Value Date   LABURIC 5.2 12/27/2020  On allopurinol      Relevant Orders   Uric acid   Other Visit Diagnoses     Mixed hyperlipidemia       Relevant Orders   Lipid Profile    Need for pneumococcal 20-valent conjugate vaccination       Relevant Orders   Pneumococcal conjugate vaccine 20-valent (Prevnar 20) (Completed)       No orders of the defined types were placed in this encounter.   Follow-up: Return in about 4 months (around 01/01/2022) for HTN and dementia.    Lindell Spar, MD

## 2021-09-01 NOTE — Assessment & Plan Note (Signed)
Lab Results  Component Value Date   HGBA1C 6.7 (A) 05/02/2021   HGBA1C 6.7 05/02/2021    Diet controlled currently DC glipizide in the past to avoid hypoglycemia Advised to follow diabetic diet On statin and ACEi Reviewed CMP and lipid panel Diabetic eye exam: Advised to follow up with Ophthalmology for diabetic eye exam

## 2021-09-01 NOTE — Assessment & Plan Note (Signed)
MMSE: 26 Has mild cognitive decline with short-term memory loss On donepezil 5 mg qHS Advised to wear hearing aids as hearing loss can impair cognition as well 

## 2021-09-01 NOTE — Assessment & Plan Note (Signed)
Well controlled currently with tamsulosin

## 2021-09-09 ENCOUNTER — Telehealth: Payer: Self-pay | Admitting: *Deleted

## 2021-09-09 NOTE — Patient Outreach (Signed)
  Care Coordination   09/09/2021 Name: Daniel Mercado MRN: [id number] DOB: 07/30/1931   Care Coordination Outreach Attempts:  A second unsuccessful outreach was attempted today to offer the patient with information about available care coordination services as a benefit of their health plan.     Follow Up Plan:  Additional outreach attempts will be made to offer the patient care coordination information and services.   Encounter Outcome:  No Answer  Care Coordination Interventions Activated:  No   Care Coordination Interventions:  No, not indicated    Jacqlyn Larsen [facility], BSN RN Care Coordinator [phone]

## 2021-09-15 ENCOUNTER — Telehealth: Payer: Self-pay | Admitting: *Deleted

## 2021-09-15 NOTE — Patient Outreach (Signed)
  Care Coordination   09/15/2021 Name: Daniel Mercado MRN: [id number] DOB: 1931-07-09   Care Coordination Outreach Attempts:  A third unsuccessful outreach was attempted today to offer the patient with information about available care coordination services as a benefit of their health plan.   Follow Up Plan:  No further outreach attempts will be made at this time. We have been unable to contact the patient to offer or enroll patient in care coordination services  Encounter Outcome:  No Answer  Care Coordination Interventions Activated:  No   Care Coordination Interventions:  No, not indicated    Jacqlyn Larsen [facility], BSN RN Care Coordinator [phone]

## 2021-09-22 ENCOUNTER — Other Ambulatory Visit: Payer: Self-pay | Admitting: Internal Medicine

## 2021-09-22 DIAGNOSIS — N400 Enlarged prostate without lower urinary tract symptoms: Secondary | ICD-10-CM

## 2021-09-22 DIAGNOSIS — E038 Other specified hypothyroidism: Secondary | ICD-10-CM

## 2021-09-26 ENCOUNTER — Ambulatory Visit (INDEPENDENT_AMBULATORY_CARE_PROVIDER_SITE_OTHER): Payer: Medicare Other

## 2021-09-26 DIAGNOSIS — Z0000 Encounter for general adult medical examination without abnormal findings: Secondary | ICD-10-CM

## 2021-09-26 NOTE — Patient Instructions (Addendum)
  Daniel Mercado , Thank you for taking time to come for your Medicare Wellness Visit. I appreciate your ongoing commitment to your health goals. Please review the following plan we discussed and let me know if I can assist you in the future.   Flu vaccine given on walk in basis on Wed's from 8-12. Next shingrix vaccine due after 11/26/21 RSV vaccine is a single dose vaccine.   These are the goals we discussed:  Goals      Patient Stated     Would like to move more        This is a list of the screening recommended for you and due dates:  Health Maintenance  Topic Date Due   Zoster (Shingles) Vaccine (1 of 2) Never done   COVID-19 Vaccine (2 - Moderna risk series) 12/08/2019   Flu Shot  08/16/2021   Hemoglobin A1C  11/01/2021   Complete foot exam   12/31/2021   Eye exam for diabetics  04/20/2022   Tetanus Vaccine  11/15/2025   Pneumonia Vaccine  Completed   HPV Vaccine  Aged Out

## 2021-09-26 NOTE — Progress Notes (Signed)
I connected with  Daniel Mercado on 09/26/21 by a audio enabled telemedicine application and verified that I am speaking with the correct person using two identifiers.  Patient Location: Home  Provider Location: Home Office  I discussed the limitations of evaluation and management by telemedicine. The patient expressed understanding and agreed to proceed.  Subjective:   Daniel Mercado is a 88 y.o. male who presents for Medicare Annual/Subsequent preventive examination.  Review of Systems     Cardiac Risk Factors include: advanced age (>64mn, >>51women);diabetes mellitus;hypertension;sedentary lifestyle     Objective:    There were no vitals filed for this visit. There is no height or weight on file to calculate BMI.     05/20/2021    8:28 PM 09/19/2020    8:07 AM 11/06/2014    9:32 AM 10/09/2014    3:35 PM  Advanced Directives  Does Patient Have a Medical Advance Directive? No Yes Yes Yes  Type of Advance Directive  Living will Living will [place] Does patient want to make changes to medical advance directive?  No - Patient declined    Copy of [place]?   No - copy requested   Would patient like information on creating a medical advance directive?   No - patient declined information     Current Medications (verified) Outpatient Encounter Medications as of 09/26/2021  Medication Sig   allopurinol (ZYLOPRIM) 100 MG tablet Take 1 tablet by mouth once daily   aspirin 81 MG tablet Take 81 mg by mouth daily.   docusate sodium (COLACE) 100 MG capsule Take 1 capsule (100 mg total) by mouth daily as needed for mild constipation.   donepezil (ARICEPT) 5 MG tablet Take 1 tablet (5 mg total) by mouth at bedtime.   levothyroxine (SYNTHROID) 75 MCG tablet Take 1 tablet by mouth once daily   lisinopril (ZESTRIL) 5 MG tablet Take 1 tablet by mouth once daily   polyethylene glycol powder (GLYCOLAX/MIRALAX) 17 GM/SCOOP powder Take 17 g by mouth  daily as needed for mild constipation or moderate constipation.    tamsulosin (FLOMAX) 0.4 MG CAPS capsule Take 2 capsules by mouth once daily   No facility-administered encounter medications on file as of 09/26/2021.    Allergies (verified) Patient has no known allergies.   History: Past Medical History:  Diagnosis Date   Erectile dysfunction    Gout    History of colon polyps    Left ureteral stone    Nocturia    Peripheral neuropathy    Sciatica    Type 2 diabetes mellitus ([place]    Past Surgical History:  Procedure Laterality Date   [place]  Family History  Problem Relation Age of Onset   Diabetes Other    Social History   Socioeconomic History   Marital status: Married    Spouse name: Not on file   Number of children: Not on file   Years of education: Not on file   Highest education level: Not on file  Occupational History   Not on file  Tobacco Use   Smoking status: Former   Smokeless tobacco: Current    Types: Chew  Substance and Sexual Activity   Alcohol use: No   Drug use: No   Sexual activity: Not on file  Other Topics Concern   Not on file  Social History Narrative  Not on file   Social Determinants of Health   Financial Resource Strain: Low Risk  (09/19/2020)   Overall Financial Resource Strain (CARDIA)    Difficulty of Paying Living Expenses: Not hard at all  Food Insecurity: No Food Insecurity (09/26/2021)   Hunger Vital Sign    Worried About Running Out of Food in the Last Year: Never true    Ran Out of Food in the Last Year: Never true  Transportation Needs: No Transportation Needs (09/26/2021)   PRAPARE - Hydrologist (Medical): No    Lack of Transportation (Non-Medical): No  Physical Activity: Inactive (09/26/2021)   Exercise Vital Sign    Days of Exercise per Week: 0 days    Minutes of Exercise per Session: 0 min  Stress: No Stress Concern Present  (09/19/2020)   [place]    Feeling of Stress : Not at all  Social Connections: Moderately Isolated (09/26/2021)   Social Connection and Isolation Panel [NHANES]    Frequency of Communication with Friends and Family: More than three times a week    Frequency of Social Gatherings with Friends and Family: More than three times a week    Attends Religious Services: More than 4 times per year    Active Member of [employer] or Organizations: No    Attends Archivist Meetings: Never    Marital Status: Widowed    Tobacco Counseling Ready to quit: Not Answered Counseling given: Not Answered   Clinical Intake:  Pre-visit preparation completed: Yes  Pain : No/denies pain     Nutritional Status: BMI 25 -29 Overweight Diabetes: Yes CBG done?: No Did pt. bring in CBG monitor from home?: No     Diabetic?Nutrition Risk Assessment:  Has the patient had any N/V/D within the last 2 months?  No  Does the patient have any non-healing wounds?  No  Has the patient had any unintentional weight loss or weight gain?  No   Diabetes:  Is the patient diabetic?  No  If diabetic, was a CBG obtained today?  No  Did the patient bring in their glucometer from home?  No  How often do you monitor your CBG's?   Financial Strains and Diabetes Management:  Are you having any financial strains with the device, your supplies or your medication? No .  Does the patient want to be seen by Chronic Care Management for management of their diabetes?  No  Would the patient like to be referred to a Nutritionist or for Diabetic Management?  No   Diabetic Exams:  Diabetic Eye Exam: Completed   Diabetic Foot Exam: Completed             Activities of Daily Living    09/26/2021    4:00 PM  In your present state of health, do you have any difficulty performing the following activities:  Hearing? 1  Vision? 0  Difficulty concentrating  or making decisions? 0  Walking or climbing stairs? 0  Dressing or bathing? 0  Doing errands, shopping? 0  Preparing Food and eating ? N  Using the Toilet? N  In the past six months, have you accidently leaked urine? N  Do you have problems with loss of bowel control? N  Managing your Medications? Y  Managing your Finances? N  Housekeeping or managing your Housekeeping? N    Patient Care Team: Lindell Spar, MD as PCP - General (Internal Medicine)  Indicate any recent Medical Services you may have received from other than [facility] providers in the past year (date may be approximate).     Assessment:   This is a routine wellness examination for Jeffre.  Hearing/Vision screen No results found.  Dietary issues and exercise activities discussed: Current Exercise Habits: The patient does not participate in regular exercise at present   Goals Addressed   None    Depression Screen    09/01/2021    8:24 AM 05/02/2021    8:28 AM 12/31/2020    8:27 AM 09/19/2020    8:09 AM 09/19/2020    8:04 AM 08/27/2020    8:07 AM 04/15/2020    8:49 AM  PHQ 2/9 Scores  PHQ - 2 Score 0 0 0 0 0 0 0    Fall Risk    09/26/2021    3:59 PM 09/01/2021    8:24 AM 05/02/2021    8:28 AM 12/31/2020    8:27 AM 09/19/2020    8:08 AM  Fall Risk   Falls in the past year? '1 1 1 '$ 0 0  Number falls in past yr: '1 1 1 '$ 0 0  Injury with Fall? 1 1 0 0 0  Risk for fall due to :  History of fall(s);Impaired balance/gait History of fall(s);Impaired balance/gait No Fall Risks No Fall Risks  Follow up  Falls evaluation completed;Falls prevention discussed;Education provided Falls evaluation completed;Falls prevention discussed Falls evaluation completed Falls evaluation completed    FALL RISK PREVENTION PERTAINING TO THE HOME:  Any stairs in or around the home? No  If so, are there any without handrails? No  Home free of loose throw rugs in walkways, pet beds, electrical cords, etc? Yes  Adequate lighting in your home to  reduce risk of falls? Yes   ASSISTIVE DEVICES UTILIZED TO PREVENT FALLS:  Life alert? No  Use of a cane, walker or w/c? No  Grab bars in the bathroom? yes Shower chair or bench in shower? No  Elevated toilet seat or a handicapped toilet? No     Cognitive Function:    05/02/2021    9:12 AM 09/19/2020    8:10 AM 06/03/2019   10:07 AM  MMSE - Mini Mental State Exam  Not completed:  Unable to complete   Orientation to time 4  5  Orientation to Place 5  5  Registration 2  3  Attention/ Calculation 5  3  Recall 2  2  Language- name 2 objects 2    Language- repeat 1    Language- follow 3 step command 3    Language- read & follow direction 1    Write a sentence 1    Copy design 0    Total score 26          09/26/2021    4:09 PM 09/19/2020    8:10 AM  6CIT Screen  What Year? 0 points 0 points  What month? 0 points 0 points  What time? 0 points 0 points  Count back from 20  0 points  Months in reverse  0 points  Repeat phrase  0 points  Total Score  0 points   Immunizations Immunization History  Administered Date(s) Administered   Fluad Quad(high Dose 65+) 11/20/2019, 11/03/2020   Influenza Split 01/02/2013   Influenza, High Dose Seasonal PF 10/29/2017   Influenza,inj,Quad PF,6+ Mos 11/18/2013, 12/24/2014, 12/28/2016   Influenza-Unspecified 11/29/2011, 11/16/2015, 10/22/2017   Moderna Sars-Covid-2 Vaccination 11/10/2019   PNEUMOCOCCAL CONJUGATE-20 09/01/2021  Pneumococcal Conjugate-13 01/01/2014   Pneumococcal-Unspecified 01/15/2010   Tdap 11/16/2015    TDAP status: Up to date  Flu Vaccine status: Due, Education has been provided regarding the importance of this vaccine. Advised may receive this vaccine at local pharmacy or Health Dept. Aware to provide a copy of the vaccination record if obtained from local pharmacy or Health Dept. Verbalized acceptance and understanding.  Pneumococcal vaccine status: Up to date  Covid-19 vaccine status: Completed  vaccines  Qualifies for Shingles Vaccine? Yes   Zostavax completed No   Shingrix Completed?: Yes  Screening Tests Health Maintenance  Topic Date Due   Zoster Vaccines- Shingrix (1 of 2) Never done   COVID-19 Vaccine (2 - Moderna risk series) 12/08/2019   INFLUENZA VACCINE  08/16/2021   HEMOGLOBIN A1C  11/01/2021   FOOT EXAM  12/31/2021   OPHTHALMOLOGY EXAM  04/20/2022   TETANUS/TDAP  11/15/2025   Pneumonia Vaccine 42+ Years old  Completed   HPV VACCINES  Aged Out    Health Maintenance  Health Maintenance Due  Topic Date Due   Zoster Vaccines- Shingrix (1 of 2) Never done   COVID-19 Vaccine (2 - Moderna risk series) 12/08/2019   INFLUENZA VACCINE  08/16/2021    Colorectal cancer screening: No longer required.   Lung Cancer Screening: (Low Dose CT Chest recommended if Age 65-80 years, 30 pack-year currently smoking OR have quit w/in 15years.) does not qualify.   Lung Cancer Screening Referral: na  Additional Screening:  Hepatitis C Screening: does not qualify  Vision Screening: Recommended annual ophthalmology exams for early detection of glaucoma and other disorders of the eye. Is the patient up to date with their annual eye exam?  Yes  Who is the provider or what is the name of the office in which the patient attends annual eye exams? Dr Richardean Sale  If pt is not established with a provider, would they like to be referred to a provider to establish care? No .   Dental Screening: Recommended annual dental exams for proper oral hygiene  Community Resource Referral / Chronic Care Management: CRR required this visit?  No   CCM required this visit?  No      Plan:     I have personally reviewed and noted the following in the patient's chart:   Medical and social history Use of alcohol, tobacco or illicit drugs  Current medications and supplements including opioid prescriptions. Patient is not currently taking opioid prescriptions. Functional ability and  status Nutritional status Physical activity Advanced directives List of other physicians Hospitalizations, surgeries, and ER visits in previous 12 months Vitals Screenings to include cognitive, depression, and falls Referrals and appointments  In addition, I have reviewed and discussed with patient certain preventive protocols, quality metrics, and best practice recommendations. A written personalized care plan for preventive services as well as general preventive health recommendations were provided to patient.     Eual Fines, LPN   4/69/6295   Nurse Notes: .awv

## 2021-12-16 ENCOUNTER — Other Ambulatory Visit: Payer: Self-pay | Admitting: Internal Medicine

## 2021-12-16 DIAGNOSIS — G309 Alzheimer's disease, unspecified: Secondary | ICD-10-CM

## 2021-12-16 DIAGNOSIS — E038 Other specified hypothyroidism: Secondary | ICD-10-CM

## 2021-12-16 DIAGNOSIS — N400 Enlarged prostate without lower urinary tract symptoms: Secondary | ICD-10-CM

## 2021-12-26 DIAGNOSIS — I10 Essential (primary) hypertension: Secondary | ICD-10-CM | POA: Diagnosis not present

## 2021-12-26 DIAGNOSIS — G309 Alzheimer's disease, unspecified: Secondary | ICD-10-CM | POA: Diagnosis not present

## 2021-12-26 DIAGNOSIS — N1831 Chronic kidney disease, stage 3a: Secondary | ICD-10-CM | POA: Diagnosis not present

## 2021-12-26 DIAGNOSIS — E1169 Type 2 diabetes mellitus with other specified complication: Secondary | ICD-10-CM | POA: Diagnosis not present

## 2021-12-26 DIAGNOSIS — M1000 Idiopathic gout, unspecified site: Secondary | ICD-10-CM | POA: Diagnosis not present

## 2021-12-26 DIAGNOSIS — E782 Mixed hyperlipidemia: Secondary | ICD-10-CM | POA: Diagnosis not present

## 2021-12-26 DIAGNOSIS — F02A0 Dementia in other diseases classified elsewhere, mild, without behavioral disturbance, psychotic disturbance, mood disturbance, and anxiety: Secondary | ICD-10-CM | POA: Diagnosis not present

## 2021-12-28 LAB — CMP14+EGFR
ALT: 18 IU/L (ref 0–44)
AST: 24 IU/L (ref 0–40)
Albumin/Globulin Ratio: 1.4 (ref 1.2–2.2)
Albumin: 3.9 g/dL (ref 3.7–4.7)
Alkaline Phosphatase: 86 IU/L (ref 44–121)
BUN/Creatinine Ratio: 15 (ref 10–24)
BUN: 17 mg/dL (ref 8–27)
Bilirubin Total: 0.4 mg/dL (ref 0.0–1.2)
CO2: 26 mmol/L (ref 20–29)
Calcium: 9.5 mg/dL (ref 8.6–10.2)
Chloride: 103 mmol/L (ref 96–106)
Creatinine, Ser: 1.17 mg/dL (ref 0.76–1.27)
Globulin, Total: 2.8 g/dL (ref 1.5–4.5)
Glucose: 133 mg/dL — ABNORMAL HIGH (ref 70–99)
Potassium: 4.7 mmol/L (ref 3.5–5.2)
Sodium: 142 mmol/L (ref 134–144)
Total Protein: 6.7 g/dL (ref 6.0–8.5)
eGFR: 60 mL/min/{1.73_m2} (ref >59)

## 2021-12-28 LAB — CBC WITH DIFFERENTIAL/PLATELET
Basophils Absolute: 0.1 10*3/uL (ref 0.0–0.2)
Basos: 1 %
EOS (ABSOLUTE): 0.2 10*3/uL (ref 0.0–0.4)
Eos: 4 %
Hematocrit: 42 % (ref 37.5–51.0)
Hemoglobin: 14 g/dL (ref 13.0–17.7)
Immature Grans (Abs): 0 10*3/uL (ref 0.0–0.1)
Immature Granulocytes: 0 %
Lymphocytes Absolute: 2.2 10*3/uL (ref 0.7–3.1)
Lymphs: 35 %
MCH: 30.9 pg (ref 26.6–33.0)
MCHC: 33.3 g/dL (ref 31.5–35.7)
MCV: 93 fL (ref 79–97)
Monocytes Absolute: 0.4 10*3/uL (ref 0.1–0.9)
Monocytes: 6 %
Neutrophils Absolute: 3.5 10*3/uL (ref 1.4–7.0)
Neutrophils: 54 %
Platelets: 265 10*3/uL (ref 150–450)
RBC: 4.53 x10E6/uL (ref 4.14–5.80)
RDW: 13.2 % (ref 11.6–15.4)
WBC: 6.3 10*3/uL (ref 3.4–10.8)

## 2021-12-28 LAB — LIPID PANEL
Chol/HDL Ratio: 3.8 ratio (ref 0.0–5.0)
Cholesterol, Total: 159 mg/dL (ref 100–199)
HDL: 42 mg/dL (ref >39)
LDL Chol Calc (NIH): 100 mg/dL — ABNORMAL HIGH (ref 0–99)
Triglycerides: 91 mg/dL (ref 0–149)
VLDL Cholesterol Cal: 17 mg/dL (ref 5–40)

## 2021-12-28 LAB — HEMOGLOBIN A1C
Est. average glucose Bld gHb Est-mCnc: 146 mg/dL
Hgb A1c MFr Bld: 6.7 % — ABNORMAL HIGH (ref 4.8–5.6)

## 2021-12-28 LAB — URIC ACID: Uric Acid: 6 mg/dL (ref 3.8–8.4)

## 2021-12-28 LAB — VITAMIN D 25 HYDROXY (VIT D DEFICIENCY, FRACTURES): Vit D, 25-Hydroxy: 28.5 ng/mL — ABNORMAL LOW (ref 30.0–100.0)

## 2021-12-28 LAB — TSH: TSH: 7.91 u[IU]/mL — ABNORMAL HIGH (ref 0.450–4.500)

## 2022-01-02 ENCOUNTER — Encounter: Payer: Self-pay | Admitting: Internal Medicine

## 2022-01-02 ENCOUNTER — Ambulatory Visit (INDEPENDENT_AMBULATORY_CARE_PROVIDER_SITE_OTHER): Payer: Medicare Other | Admitting: Internal Medicine

## 2022-01-02 VITALS — BP 148/84 | HR 73 | Ht 72.0 in | Wt 190.6 lb

## 2022-01-02 DIAGNOSIS — E039 Hypothyroidism, unspecified: Secondary | ICD-10-CM | POA: Diagnosis not present

## 2022-01-02 DIAGNOSIS — H6123 Impacted cerumen, bilateral: Secondary | ICD-10-CM

## 2022-01-02 DIAGNOSIS — E1169 Type 2 diabetes mellitus with other specified complication: Secondary | ICD-10-CM

## 2022-01-02 DIAGNOSIS — W19XXXA Unspecified fall, initial encounter: Secondary | ICD-10-CM | POA: Diagnosis not present

## 2022-01-02 DIAGNOSIS — I10 Essential (primary) hypertension: Secondary | ICD-10-CM

## 2022-01-02 DIAGNOSIS — Z23 Encounter for immunization: Secondary | ICD-10-CM

## 2022-01-02 DIAGNOSIS — N1831 Chronic kidney disease, stage 3a: Secondary | ICD-10-CM | POA: Diagnosis not present

## 2022-01-02 DIAGNOSIS — F02A0 Dementia in other diseases classified elsewhere, mild, without behavioral disturbance, psychotic disturbance, mood disturbance, and anxiety: Secondary | ICD-10-CM | POA: Diagnosis not present

## 2022-01-02 DIAGNOSIS — G309 Alzheimer's disease, unspecified: Secondary | ICD-10-CM | POA: Diagnosis not present

## 2022-01-02 NOTE — Patient Instructions (Addendum)
Please continue taking medications as prescribed.  Please continue to follow low salt diet and perform moderate exercise/walking as tolerated.  Please get blood tests done before the next visit.

## 2022-01-02 NOTE — Assessment & Plan Note (Addendum)
BP Readings from Last 1 Encounters:  01/02/22 (!) 144/84   Elevated today as he has not had his medications yet Usually well-controlled with Lisinopril Counseled for compliance with the medications Advised DASH diet and moderate exercise/walking as tolerated

## 2022-01-02 NOTE — Progress Notes (Signed)
Established Patient Office Visit  Subjective:  Patient ID: Daniel Mercado, male    DOB: 01/21/1931  Age: 88 y.o. MRN: [id number]  CC:  Chief Complaint  Patient presents with   Follow-up    For hypertension, patient had a fall and hurt his wrist. Patient also wants his ears checked.    HPI Daniel Mercado is a 88 y.o. male with past medical history of DM, BPH, DDD of lumbar spine s/p disc repair and gout who presents for f/u of her chronic medical conditions.  His daughter-in-law is present during the visit today.  Dementia: He is on donepezil for dementia.  He has not had any episodes of agitation, delusion or hallucinations.  He has a hearing aid now, but has excess earwax due to it.  He does not wear hearing aid regularly.  His BP was elevated today.  Of note, he has not had his medications today. BP is well-controlled at home. Takes medications regularly. Patient denies headache, dizziness, chest pain, dyspnea or palpitations.  His HbA1C was 6.7. He used to take Glipizide, which was stopped to avoid hypoglycemia risk. Denies any polyuria or polyphagia. Does not recall trying any other medication for it.  Constipation: He takes MiraLAX as needed, but has stool urgency or loose BM with it.  Denies any melena or hematochezia currently.   Hypothyroidism: His TSH was elevated.  But his daughter-in-law reported that he was having URTI symptoms when he had blood tests done.  He denies any recent change in appetite or weight.  He currently takes levothyroxine 75 mcg daily.  He had a mechanical fall at home around 11/18 while he was trying to walk past his dog and his knee gave out.  Denies any prodromal symptoms before the fall.  He had left wrist swelling and pain, which has improved now.    Past Medical History:  Diagnosis Date   Erectile dysfunction    Gout    History of colon polyps    Left ureteral stone    Nocturia    Peripheral neuropathy    Sciatica    Type 2 diabetes mellitus  ([place])     Past Surgical History:  Procedure Laterality Date   COLONOSCOPY  last one 2009   [place]    Family History  Problem Relation Age of Onset   Diabetes Other     Social History   Socioeconomic History   Marital status: Married    Spouse name: Not on file   Number of children: Not on file   Years of education: Not on file   Highest education level: Not on file  Occupational History   Not on file  Tobacco Use   Smoking status: Former   Smokeless tobacco: Current    Types: Chew  Substance and Sexual Activity   Alcohol use: No   Drug use: No   Sexual activity: Not on file  Other Topics Concern   Not on file  Social History Narrative   Not on file   Social Determinants of Health   Financial Resource Strain: Low Risk  (09/19/2020)   Overall Financial Resource Strain (CARDIA)    Difficulty of Paying Living Expenses: Not hard at all  Food Insecurity: No Food Insecurity (09/26/2021)   Hunger Vital Sign    Worried About Running Out of Food in the Last Year: Never true    Ran Out of Food in the Last Year: Never true  Transportation Needs: No Transportation Needs (09/26/2021)   PRAPARE - Hydrologist (Medical): No    Lack of Transportation (Non-Medical): No  Physical Activity: Inactive (09/26/2021)   Exercise Vital Sign    Days of Exercise per Week: 0 days    Minutes of Exercise per Session: 0 min  Stress: No Stress Concern Present (09/19/2020)   [place]    Feeling of Stress : Not at all  Social Connections: Moderately Isolated (09/26/2021)   Social Connection and Isolation Panel [NHANES]    Frequency of Communication with Friends and Family: More than three times a week    Frequency of Social Gatherings with Friends and Family: More than three times a week    Attends Religious Services: More than 4 times per year    Active Member of [employer] or  Organizations: No    Attends Archivist Meetings: Never    Marital Status: Widowed  Intimate Partner Violence: Not At Risk (09/19/2020)   Humiliation, Afraid, Rape, and Kick questionnaire    Fear of Current or Ex-Partner: No    Emotionally Abused: No    Physically Abused: No    Sexually Abused: No    Outpatient Medications Prior to Visit  Medication Sig Dispense Refill   allopurinol (ZYLOPRIM) 100 MG tablet Take 1 tablet by mouth once daily 90 tablet 0   aspirin 81 MG tablet Take 81 mg by mouth daily.     docusate sodium (COLACE) 100 MG capsule Take 1 capsule (100 mg total) by mouth daily as needed for mild constipation. 30 capsule 5   donepezil (ARICEPT) 5 MG tablet TAKE 1 TABLET BY MOUTH AT BEDTIME 90 tablet 0   levothyroxine (SYNTHROID) 75 MCG tablet Take 1 tablet by mouth once daily 90 tablet 0   lisinopril (ZESTRIL) 5 MG tablet Take 1 tablet by mouth once daily 90 tablet 0   polyethylene glycol powder (GLYCOLAX/MIRALAX) 17 GM/SCOOP powder Take 17 g by mouth daily as needed for mild constipation or moderate constipation.      tamsulosin (FLOMAX) 0.4 MG CAPS capsule Take 2 capsules by mouth once daily 180 capsule 0   No facility-administered medications prior to visit.    No Known Allergies  ROS Review of Systems  Constitutional:  Negative for chills and fever.  HENT:  Positive for hearing loss. Negative for congestion and sore throat.   Eyes:  Negative for pain and discharge.  Respiratory:  Negative for cough and shortness of breath.   Cardiovascular:  Negative for chest pain and palpitations.  Gastrointestinal:  Positive for constipation. Negative for diarrhea, nausea and vomiting.  Endocrine: Negative for polydipsia and polyuria.  Genitourinary:  Negative for dysuria and hematuria.  Musculoskeletal:  Negative for neck pain and neck stiffness.  Skin:  Negative for rash.  Neurological:  Negative for dizziness, weakness, numbness and headaches.   Psychiatric/Behavioral:  Negative for agitation and behavioral problems. The patient is nervous/anxious.       Objective:    Physical Exam Vitals reviewed.  Constitutional:      General: He is not in acute distress.    Appearance: He is not diaphoretic.  HENT:     Head: Normocephalic and atraumatic.     Right Ear: There is impacted cerumen.     Left Ear: There is impacted cerumen.     Nose: Nose normal.     Mouth/Throat:     Mouth: Mucous membranes are moist.  Eyes:     General: No scleral icterus.    Extraocular Movements: Extraocular movements intact.  Cardiovascular:     Rate and Rhythm: Normal rate and regular rhythm.     Pulses: Normal pulses.     Heart sounds: Normal heart sounds. No murmur heard. Pulmonary:     Breath sounds: Normal breath sounds. No wheezing or rales.  Musculoskeletal:     Cervical back: Neck supple. No tenderness.     Right lower leg: No edema.     Left lower leg: No edema.  Skin:    General: Skin is warm.     Findings: Rash (Seborrheic keratosis on forearms - papular rash) present.  Neurological:     General: No focal deficit present.     Mental Status: He is alert and oriented to person, place, and time.     Comments: MMSE: 26  Psychiatric:        Mood and Affect: Mood normal.        Behavior: Behavior normal.     BP (!) 148/84 (BP Location: Left Arm, Patient Position: Sitting, Cuff Size: Normal)   Pulse 73   Ht 6' (1.829 m)   Wt 190 lb 9.6 oz (86.5 kg)   SpO2 94%   BMI 25.85 kg/m  Wt Readings from Last 3 Encounters:  01/02/22 190 lb 9.6 oz (86.5 kg)  09/01/21 197 lb 3.2 oz (89.4 kg)  05/20/21 191 lb 12.8 oz (87 kg)    Lab Results  Component Value Date   TSH 7.910 (H) 12/26/2021   Lab Results  Component Value Date   WBC 6.3 12/26/2021   HGB 14.0 12/26/2021   HCT 42.0 12/26/2021   MCV 93 12/26/2021   PLT 265 12/26/2021   Lab Results  Component Value Date   NA 142 12/26/2021   K 4.7 12/26/2021   CO2 26 12/26/2021    GLUCOSE 133 (H) 12/26/2021   BUN 17 12/26/2021   CREATININE 1.17 12/26/2021   BILITOT 0.4 12/26/2021   ALKPHOS 86 12/26/2021   AST 24 12/26/2021   ALT 18 12/26/2021   PROT 6.7 12/26/2021   ALBUMIN 3.9 12/26/2021   CALCIUM 9.5 12/26/2021   ANIONGAP 7 05/20/2021   EGFR 60 12/26/2021   Lab Results  Component Value Date   CHOL 159 12/26/2021   Lab Results  Component Value Date   HDL 42 12/26/2021   Lab Results  Component Value Date   LDLCALC 100 (H) 12/26/2021   Lab Results  Component Value Date   TRIG 91 12/26/2021   Lab Results  Component Value Date   CHOLHDL 3.8 12/26/2021   Lab Results  Component Value Date   HGBA1C 6.7 (H) 12/26/2021      Assessment & Plan:   Problem List Items Addressed This Visit       Cardiovascular and Mediastinum   Essential hypertension, benign    BP Readings from Last 1 Encounters:  01/02/22 (!) 144/84  Elevated today as he has not had his medications yet Usually well-controlled with Lisinopril Counseled for compliance with the medications Advised DASH diet and moderate exercise/walking as tolerated        Endocrine   Type 2 diabetes mellitus with other specified complication ([place]) - Primary    Lab Results  Component Value Date   HGBA1C 6.7 (H) 12/26/2021  Associated with HTN and HLD Diet controlled currently DCed glipizide in the past to avoid hypoglycemia Advised to follow diabetic diet On statin and ACEi Reviewed CMP  and lipid panel Diabetic eye exam: Advised to follow up with Ophthalmology for diabetic eye exam      Hypothyroidism    Lab Results  Component Value Date   TSH 7.910 (H) 12/26/2021  Could be elevated due to recent URTI, would avoid changing dose of levothyroxine On levothyroxine 75 mcg Check TSH and free T4      Relevant Orders   TSH + free T4   Basic Metabolic Panel (BMET)     Nervous and Auditory   Mild Alzheimer's dementia without behavioral disturbance, psychotic disturbance, mood  disturbance, or anxiety (HCC)    MMSE: 26 Has mild cognitive decline with short-term memory loss On donepezil 5 mg qHS Advised to wear hearing aids as hearing loss can impair cognition as well      Relevant Orders   Basic Metabolic Panel (BMET)   Bilateral impacted cerumen    Bilateral ear irrigation done today, he tolerated procedure well Advised to use Debrox eardrops as needed for excess earwax        Genitourinary   Stage 3a chronic kidney disease ([place])    Last BMP reviewed Maintain adequate hydration Avoid nephrotoxic agents      Relevant Orders   Basic Metabolic Panel (BMET)     Other   Fall    Likely mechanical fall Needs to use walking support and avoid overstepping or taking bigger steps Wrist swelling and pain resolved now, no head injury - no need for imaging for now      Other Visit Diagnoses     Need for immunization against influenza       Relevant Orders   Flu Vaccine QUAD High Dose(Fluad) (Completed)       No orders of the defined types were placed in this encounter.   Follow-up: Return in about 4 months (around 05/04/2022) for Hypothyroidism and CKD.    Lindell Spar, MD

## 2022-01-02 NOTE — Assessment & Plan Note (Addendum)
Lab Results  Component Value Date   TSH 7.910 (H) 12/26/2021   Could be elevated due to recent URTI, would avoid changing dose of levothyroxine On levothyroxine 75 mcg Check TSH and free T4

## 2022-01-02 NOTE — Assessment & Plan Note (Signed)
Last BMP reviewed Maintain adequate hydration Avoid nephrotoxic agents

## 2022-01-02 NOTE — Assessment & Plan Note (Signed)
Bilateral ear irrigation done today, he tolerated procedure well Advised to use Debrox eardrops as needed for excess earwax

## 2022-01-02 NOTE — Assessment & Plan Note (Signed)
Lab Results  Component Value Date   HGBA1C 6.7 (H) 12/26/2021   Associated with HTN and HLD Diet controlled currently DCed glipizide in the past to avoid hypoglycemia Advised to follow diabetic diet On statin and ACEi Reviewed CMP and lipid panel Diabetic eye exam: Advised to follow up with Ophthalmology for diabetic eye exam

## 2022-01-02 NOTE — Assessment & Plan Note (Signed)
MMSE: 26 Has mild cognitive decline with short-term memory loss On donepezil 5 mg qHS Advised to wear hearing aids as hearing loss can impair cognition as well

## 2022-01-02 NOTE — Assessment & Plan Note (Signed)
Likely mechanical fall Needs to use walking support and avoid overstepping or taking bigger steps Wrist swelling and pain resolved now, no head injury - no need for imaging for now

## 2022-03-16 ENCOUNTER — Encounter: Payer: Self-pay | Admitting: Radiology

## 2022-03-26 ENCOUNTER — Other Ambulatory Visit: Payer: Self-pay | Admitting: Internal Medicine

## 2022-03-26 DIAGNOSIS — F02A0 Dementia in other diseases classified elsewhere, mild, without behavioral disturbance, psychotic disturbance, mood disturbance, and anxiety: Secondary | ICD-10-CM

## 2022-04-27 DIAGNOSIS — F02A0 Dementia in other diseases classified elsewhere, mild, without behavioral disturbance, psychotic disturbance, mood disturbance, and anxiety: Secondary | ICD-10-CM | POA: Diagnosis not present

## 2022-04-27 DIAGNOSIS — E039 Hypothyroidism, unspecified: Secondary | ICD-10-CM | POA: Diagnosis not present

## 2022-04-27 DIAGNOSIS — N1831 Chronic kidney disease, stage 3a: Secondary | ICD-10-CM | POA: Diagnosis not present

## 2022-04-27 DIAGNOSIS — G309 Alzheimer's disease, unspecified: Secondary | ICD-10-CM | POA: Diagnosis not present

## 2022-04-28 LAB — BASIC METABOLIC PANEL
BUN/Creatinine Ratio: 13 (ref 10–24)
BUN: 20 mg/dL (ref 8–27)
CO2: 25 mmol/L (ref 20–29)
Calcium: 9.5 mg/dL (ref 8.6–10.2)
Chloride: 105 mmol/L (ref 96–106)
Creatinine, Ser: 1.55 mg/dL — ABNORMAL HIGH (ref 0.76–1.27)
Glucose: 116 mg/dL — ABNORMAL HIGH (ref 70–99)
Potassium: 4.9 mmol/L (ref 3.5–5.2)
Sodium: 142 mmol/L (ref 134–144)
eGFR: 43 mL/min/{1.73_m2} — ABNORMAL LOW (ref >59)

## 2022-04-28 LAB — TSH+FREE T4
Free T4: 1.17 ng/dL (ref 0.82–1.77)
TSH: 10.2 u[IU]/mL — ABNORMAL HIGH (ref 0.450–4.500)

## 2022-05-03 ENCOUNTER — Encounter: Payer: Self-pay | Admitting: Internal Medicine

## 2022-05-03 ENCOUNTER — Ambulatory Visit (INDEPENDENT_AMBULATORY_CARE_PROVIDER_SITE_OTHER): Payer: Medicare Other | Admitting: Internal Medicine

## 2022-05-03 VITALS — BP 136/80 | HR 67 | Ht 72.0 in | Wt 201.8 lb

## 2022-05-03 DIAGNOSIS — M1000 Idiopathic gout, unspecified site: Secondary | ICD-10-CM | POA: Diagnosis not present

## 2022-05-03 DIAGNOSIS — F02A0 Dementia in other diseases classified elsewhere, mild, without behavioral disturbance, psychotic disturbance, mood disturbance, and anxiety: Secondary | ICD-10-CM | POA: Diagnosis not present

## 2022-05-03 DIAGNOSIS — N400 Enlarged prostate without lower urinary tract symptoms: Secondary | ICD-10-CM | POA: Diagnosis not present

## 2022-05-03 DIAGNOSIS — I10 Essential (primary) hypertension: Secondary | ICD-10-CM | POA: Diagnosis not present

## 2022-05-03 DIAGNOSIS — E1169 Type 2 diabetes mellitus with other specified complication: Secondary | ICD-10-CM | POA: Diagnosis not present

## 2022-05-03 DIAGNOSIS — H9193 Unspecified hearing loss, bilateral: Secondary | ICD-10-CM | POA: Diagnosis not present

## 2022-05-03 DIAGNOSIS — G309 Alzheimer's disease, unspecified: Secondary | ICD-10-CM | POA: Diagnosis not present

## 2022-05-03 DIAGNOSIS — N1831 Chronic kidney disease, stage 3a: Secondary | ICD-10-CM | POA: Diagnosis not present

## 2022-05-03 DIAGNOSIS — E039 Hypothyroidism, unspecified: Secondary | ICD-10-CM | POA: Diagnosis not present

## 2022-05-03 LAB — POCT GLYCOSYLATED HEMOGLOBIN (HGB A1C): HbA1c, POC (controlled diabetic range): 6.1 % (ref 0.0–7.0)

## 2022-05-03 MED ORDER — LISINOPRIL 5 MG PO TABS: 5.0000 mg | ORAL_TABLET | Freq: Every day | ORAL | 1 refills | Status: DC

## 2022-05-03 MED ORDER — TAMSULOSIN HCL 0.4 MG PO CAPS: 0.8000 mg | ORAL_CAPSULE | Freq: Every day | ORAL | 1 refills | Status: DC

## 2022-05-03 MED ORDER — ALLOPURINOL 100 MG PO TABS: 100.0000 mg | ORAL_TABLET | Freq: Every day | ORAL | 1 refills | Status: DC

## 2022-05-03 MED ORDER — LEVOTHYROXINE SODIUM 88 MCG PO TABS: 88.0000 ug | ORAL_TABLET | Freq: Every day | ORAL | 1 refills | Status: DC

## 2022-05-03 MED ORDER — DONEPEZIL HCL 5 MG PO TABS: 5.0000 mg | ORAL_TABLET | Freq: Every day | ORAL | 1 refills | Status: DC

## 2022-05-03 NOTE — Assessment & Plan Note (Addendum)
Last BMP reviewed, recent worsening likely due to dehydration Maintain adequate hydration Avoid nephrotoxic agents If persistent drop in GFR, switch from lisinopril to amlodipine

## 2022-05-03 NOTE — Assessment & Plan Note (Signed)
Lab Results  Component Value Date   LABURIC 6.0 12/26/2021   On allopurinol

## 2022-05-03 NOTE — Patient Instructions (Signed)
Please start taking Levothyroxine 88 mcg once daily instead of 75 mcg.  Please continue to take other medications as prescribed.  Please continue to follow low carb diet and perform moderate exercise/walking as tolerated.  Please maintain at least 64 ounces of fluid in a day.  Please get fasting blood tests done before the next visit.

## 2022-05-03 NOTE — Assessment & Plan Note (Signed)
Well controlled currently with tamsulosin 

## 2022-05-03 NOTE — Assessment & Plan Note (Signed)
Lab Results  Component Value Date   HGBA1C 6.1 05/03/2022   Associated with HTN and HLD Diet controlled currently DCed glipizide in the past to avoid hypoglycemia Advised to follow diabetic diet On statin and ACEi Reviewed CMP and lipid panel Diabetic eye exam: Advised to follow up with Ophthalmology for diabetic eye exam

## 2022-05-03 NOTE — Assessment & Plan Note (Signed)
BP Readings from Last 1 Encounters:  05/03/22 136/80   Usually well-controlled with Lisinopril Counseled for compliance with the medications Advised DASH diet and moderate exercise/walking as tolerated

## 2022-05-03 NOTE — Assessment & Plan Note (Signed)
Lab Results  Component Value Date   TSH 10.200 (H) 04/27/2022   On levothyroxine 75 mcg Increased dose to 88 mcg QD Check TSH and free T4

## 2022-05-03 NOTE — Assessment & Plan Note (Signed)
Needs to use hearing aid regularly ?

## 2022-05-03 NOTE — Assessment & Plan Note (Signed)
MMSE: 26 Has mild cognitive decline with short-term memory loss On donepezil 5 mg qHS Advised to wear hearing aids as hearing loss can impair cognition as well 

## 2022-05-03 NOTE — Progress Notes (Signed)
Established Patient Office Visit  Subjective:  Patient ID: Daniel Mercado, male    DOB: 1931/09/25  Age: 89 y.o. MRN: [id number]  CC:  Chief Complaint  Patient presents with   Hypothyroidism    Follow up    HPI Daniel Mercado is a 89 y.o. male with past medical history of DM, BPH, DDD of lumbar spine s/p disc repair and gout who presents for f/u of her chronic medical conditions.  His daughter-in-law is present during the visit today.  Dementia: He is on donepezil for dementia.  He has not had any episodes of agitation, delusion or hallucinations.  He has a hearing aid now, but has excess earwax due to it.  He does not wear hearing aid regularly.  He lives alone.  His son and daughter-in-law live next-door.  He plays cards with his friends at a nearby store.  His BP was wnl today. BP is well-controlled at home. Takes medications regularly. Patient denies headache, dizziness, chest pain, dyspnea or palpitations.  His HbA1C was 6.1 today. He used to take Glipizide, which was stopped to avoid hypoglycemia risk. Denies any polyuria or polyphagia. Does not recall trying any other medication for it.  Constipation: He takes MiraLAX as needed, but has stool urgency or loose BM with it.  Denies any melena or hematochezia currently.   Hypothyroidism: His TSH was elevated. He denies any recent change in appetite or weight.  He currently takes levothyroxine 75 mcg daily.  CKD: His S. Cr. has increased and GFR has dropped to 43 from 60 in 12/23. He admits that he drinks only 2 glasses of water in a day, but takes soda pops daily.   Past Medical History:  Diagnosis Date   Erectile dysfunction    Gout    History of colon polyps    Left ureteral stone    Nocturia    Peripheral neuropathy    Sciatica    Type 2 diabetes mellitus     Past Surgical History:  Procedure Laterality Date   COLONOSCOPY  last one 2009   LUMBAR DISC SURGERY  1995  &  1967    Family History  Problem Relation Age of  Onset   Diabetes Other     Social History   Socioeconomic History   Marital status: Married    Spouse name: Not on file   Number of children: Not on file   Years of education: Not on file   Highest education level: Not on file  Occupational History   Not on file  Tobacco Use   Smoking status: Former   Smokeless tobacco: Current    Types: Chew  Substance and Sexual Activity   Alcohol use: No   Drug use: No   Sexual activity: Not on file  Other Topics Concern   Not on file  Social History Narrative   Not on file   Social Determinants of Health   Financial Resource Strain: Low Risk  (09/19/2020)   Overall Financial Resource Strain (CARDIA)    Difficulty of Paying Living Expenses: Not hard at all  Food Insecurity: No Food Insecurity (09/26/2021)   Hunger Vital Sign    Worried About Running Out of Food in the Last Year: Never true    Ran Out of Food in the Last Year: Never true  Transportation Needs: No Transportation Needs (09/26/2021)   PRAPARE - Administrator, Civil Service (Medical): No    Lack of Transportation (Non-Medical): No  Physical  Activity: Inactive (09/26/2021)   Exercise Vital Sign    Days of Exercise per Week: 0 days    Minutes of Exercise per Session: 0 min  Stress: No Stress Concern Present (09/19/2020)   [employer] of Occupational Health - Occupational Stress Questionnaire    Feeling of Stress : Not at all  Social Connections: Moderately Isolated (09/26/2021)   Social Connection and Isolation Panel [NHANES]    Frequency of Communication with Friends and Family: More than three times a week    Frequency of Social Gatherings with Friends and Family: More than three times a week    Attends Religious Services: More than 4 times per year    Active Member of [employer] or Organizations: No    Attends Banker Meetings: Never    Marital Status: Widowed  Intimate Partner Violence: Not At Risk (09/19/2020)   Humiliation, Afraid, Rape, and  Kick questionnaire    Fear of Current or Ex-Partner: No    Emotionally Abused: No    Physically Abused: No    Sexually Abused: No    Outpatient Medications Prior to Visit  Medication Sig Dispense Refill   aspirin 81 MG tablet Take 81 mg by mouth daily.     docusate sodium (COLACE) 100 MG capsule Take 1 capsule (100 mg total) by mouth daily as needed for mild constipation. 30 capsule 5   polyethylene glycol powder (GLYCOLAX/MIRALAX) 17 GM/SCOOP powder Take 17 g by mouth daily as needed for mild constipation or moderate constipation.      allopurinol (ZYLOPRIM) 100 MG tablet Take 1 tablet by mouth once daily 90 tablet 0   donepezil (ARICEPT) 5 MG tablet TAKE 1 TABLET BY MOUTH AT BEDTIME 90 tablet 0   levothyroxine (SYNTHROID) 75 MCG tablet Take 1 tablet by mouth once daily 90 tablet 0   lisinopril (ZESTRIL) 5 MG tablet Take 1 tablet by mouth once daily 90 tablet 0   tamsulosin (FLOMAX) 0.4 MG CAPS capsule Take 2 capsules by mouth once daily 180 capsule 0   No facility-administered medications prior to visit.    No Known Allergies  ROS Review of Systems  Constitutional:  Negative for chills and fever.  HENT:  Positive for hearing loss. Negative for congestion and sore throat.   Eyes:  Negative for pain and discharge.  Respiratory:  Negative for cough and shortness of breath.   Cardiovascular:  Negative for chest pain and palpitations.  Gastrointestinal:  Positive for constipation. Negative for diarrhea, nausea and vomiting.  Endocrine: Negative for polydipsia and polyuria.  Genitourinary:  Negative for dysuria and hematuria.  Musculoskeletal:  Negative for neck pain and neck stiffness.  Skin:  Negative for rash.  Neurological:  Negative for dizziness, weakness, numbness and headaches.  Psychiatric/Behavioral:  Negative for agitation and behavioral problems. The patient is nervous/anxious.       Objective:    Physical Exam Vitals reviewed.  Constitutional:      General: He is  not in acute distress.    Appearance: He is not diaphoretic.  HENT:     Head: Normocephalic and atraumatic.     Nose: Nose normal.     Mouth/Throat:     Mouth: Mucous membranes are moist.  Eyes:     General: No scleral icterus.    Extraocular Movements: Extraocular movements intact.  Cardiovascular:     Rate and Rhythm: Normal rate and regular rhythm.     Pulses: Normal pulses.     Heart sounds: Normal heart sounds. No  murmur heard. Pulmonary:     Breath sounds: Normal breath sounds. No wheezing or rales.  Musculoskeletal:     Cervical back: Neck supple. No tenderness.     Right lower leg: No edema.     Left lower leg: No edema.  Skin:    General: Skin is warm.     Findings: Rash (Seborrheic keratosis on forearms - papular rash) present.  Neurological:     General: No focal deficit present.     Mental Status: He is alert and oriented to person, place, and time.     Comments: MMSE: 26  Psychiatric:        Mood and Affect: Mood normal.        Behavior: Behavior normal.     BP 136/80 (BP Location: Left Arm, Cuff Size: Normal)   Pulse 67   Ht 6' (1.829 m)   Wt 201 lb 12.8 oz (91.5 kg)   SpO2 94%   BMI 27.37 kg/m  Wt Readings from Last 3 Encounters:  05/03/22 201 lb 12.8 oz (91.5 kg)  01/02/22 190 lb 9.6 oz (86.5 kg)  09/01/21 197 lb 3.2 oz (89.4 kg)    Lab Results  Component Value Date   TSH 10.200 (H) 04/27/2022   Lab Results  Component Value Date   WBC 6.3 12/26/2021   HGB 14.0 12/26/2021   HCT 42.0 12/26/2021   MCV 93 12/26/2021   PLT 265 12/26/2021   Lab Results  Component Value Date   NA 142 04/27/2022   K 4.9 04/27/2022   CO2 25 04/27/2022   GLUCOSE 116 (H) 04/27/2022   BUN 20 04/27/2022   CREATININE 1.55 (H) 04/27/2022   BILITOT 0.4 12/26/2021   ALKPHOS 86 12/26/2021   AST 24 12/26/2021   ALT 18 12/26/2021   PROT 6.7 12/26/2021   ALBUMIN 3.9 12/26/2021   CALCIUM 9.5 04/27/2022   ANIONGAP 7 05/20/2021   EGFR 43 (L) 04/27/2022   Lab Results   Component Value Date   CHOL 159 12/26/2021   Lab Results  Component Value Date   HDL 42 12/26/2021   Lab Results  Component Value Date   LDLCALC 100 (H) 12/26/2021   Lab Results  Component Value Date   TRIG 91 12/26/2021   Lab Results  Component Value Date   CHOLHDL 3.8 12/26/2021   Lab Results  Component Value Date   HGBA1C 6.1 05/03/2022      Assessment & Plan:   Problem List Items Addressed This Visit       Cardiovascular and Mediastinum   Essential hypertension, benign - Primary    BP Readings from Last 1 Encounters:  05/03/22 136/80  Usually well-controlled with Lisinopril Counseled for compliance with the medications Advised DASH diet and moderate exercise/walking as tolerated      Relevant Medications   lisinopril (ZESTRIL) 5 MG tablet     Endocrine   Type 2 diabetes mellitus with other specified complication    Lab Results  Component Value Date   HGBA1C 6.1 05/03/2022  Associated with HTN and HLD Diet controlled currently DCed glipizide in the past to avoid hypoglycemia Advised to follow diabetic diet On statin and ACEi Reviewed CMP and lipid panel Diabetic eye exam: Advised to follow up with Ophthalmology for diabetic eye exam      Relevant Medications   lisinopril (ZESTRIL) 5 MG tablet   Other Relevant Orders   POCT glycosylated hemoglobin (Hb A1C) (Completed)   Basic Metabolic Panel (BMET)   Hemoglobin A1c  Hypothyroidism    Lab Results  Component Value Date   TSH 10.200 (H) 04/27/2022  On levothyroxine 75 mcg Increased dose to 88 mcg QD Check TSH and free T4      Relevant Medications   levothyroxine (SYNTHROID) 88 MCG tablet   Other Relevant Orders   TSH + free T4     Nervous and Auditory   Mild Alzheimer's dementia without behavioral disturbance, psychotic disturbance, mood disturbance, or anxiety    MMSE: 26 Has mild cognitive decline with short-term memory loss On donepezil 5 mg qHS Advised to wear hearing aids as  hearing loss can impair cognition as well      Relevant Medications   donepezil (ARICEPT) 5 MG tablet     Genitourinary   BPH (benign prostatic hyperplasia)    Well controlled currently with tamsulosin      Relevant Medications   tamsulosin (FLOMAX) 0.4 MG CAPS capsule   Stage 3a chronic kidney disease    Last BMP reviewed, recent worsening likely due to dehydration Maintain adequate hydration Avoid nephrotoxic agents If persistent drop in GFR, switch from lisinopril to amlodipine      Relevant Orders   Basic Metabolic Panel (BMET)     Other   Gout    Lab Results  Component Value Date   LABURIC 6.0 12/26/2021  On allopurinol      Relevant Medications   allopurinol (ZYLOPRIM) 100 MG tablet   Other Relevant Orders   Uric acid   Meds ordered this encounter  Medications   levothyroxine (SYNTHROID) 88 MCG tablet    Sig: Take 1 tablet (88 mcg total) by mouth daily.    Dispense:  90 tablet    Refill:  1    DOSE CHANGE   donepezil (ARICEPT) 5 MG tablet    Sig: Take 1 tablet (5 mg total) by mouth at bedtime.    Dispense:  90 tablet    Refill:  1   lisinopril (ZESTRIL) 5 MG tablet    Sig: Take 1 tablet (5 mg total) by mouth daily.    Dispense:  90 tablet    Refill:  1   tamsulosin (FLOMAX) 0.4 MG CAPS capsule    Sig: Take 2 capsules (0.8 mg total) by mouth daily.    Dispense:  180 capsule    Refill:  1   allopurinol (ZYLOPRIM) 100 MG tablet    Sig: Take 1 tablet (100 mg total) by mouth daily.    Dispense:  90 tablet    Refill:  1    Follow-up: Return in about 4 months (around 09/02/2022) for HTN, CKD and hypothyroidism.    Anabel Halon, MD

## 2022-05-18 DIAGNOSIS — Z961 Presence of intraocular lens: Secondary | ICD-10-CM | POA: Diagnosis not present

## 2022-05-18 DIAGNOSIS — E119 Type 2 diabetes mellitus without complications: Secondary | ICD-10-CM | POA: Diagnosis not present

## 2022-05-18 LAB — HM DIABETES EYE EXAM

## 2022-08-29 DIAGNOSIS — M1000 Idiopathic gout, unspecified site: Secondary | ICD-10-CM | POA: Diagnosis not present

## 2022-08-29 DIAGNOSIS — E039 Hypothyroidism, unspecified: Secondary | ICD-10-CM | POA: Diagnosis not present

## 2022-08-29 DIAGNOSIS — E1169 Type 2 diabetes mellitus with other specified complication: Secondary | ICD-10-CM | POA: Diagnosis not present

## 2022-08-29 DIAGNOSIS — N1831 Chronic kidney disease, stage 3a: Secondary | ICD-10-CM | POA: Diagnosis not present

## 2022-09-06 ENCOUNTER — Encounter: Payer: Self-pay | Admitting: Internal Medicine

## 2022-09-06 ENCOUNTER — Ambulatory Visit (INDEPENDENT_AMBULATORY_CARE_PROVIDER_SITE_OTHER): Payer: Medicare Other | Admitting: Internal Medicine

## 2022-09-06 VITALS — BP 136/77 | HR 64 | Ht 72.0 in | Wt 204.6 lb

## 2022-09-06 DIAGNOSIS — K5904 Chronic idiopathic constipation: Secondary | ICD-10-CM | POA: Diagnosis not present

## 2022-09-06 DIAGNOSIS — E1169 Type 2 diabetes mellitus with other specified complication: Secondary | ICD-10-CM | POA: Diagnosis not present

## 2022-09-06 DIAGNOSIS — I10 Essential (primary) hypertension: Secondary | ICD-10-CM

## 2022-09-06 DIAGNOSIS — F02A0 Dementia in other diseases classified elsewhere, mild, without behavioral disturbance, psychotic disturbance, mood disturbance, and anxiety: Secondary | ICD-10-CM | POA: Diagnosis not present

## 2022-09-06 DIAGNOSIS — M1711 Unilateral primary osteoarthritis, right knee: Secondary | ICD-10-CM

## 2022-09-06 DIAGNOSIS — R29898 Other symptoms and signs involving the musculoskeletal system: Secondary | ICD-10-CM | POA: Diagnosis not present

## 2022-09-06 DIAGNOSIS — E039 Hypothyroidism, unspecified: Secondary | ICD-10-CM

## 2022-09-06 DIAGNOSIS — G309 Alzheimer's disease, unspecified: Secondary | ICD-10-CM

## 2022-09-06 DIAGNOSIS — N1831 Chronic kidney disease, stage 3a: Secondary | ICD-10-CM | POA: Diagnosis not present

## 2022-09-06 NOTE — Assessment & Plan Note (Signed)
Lab Results  Component Value Date   HGBA1C 6.6 (H) 08/29/2022   Associated with HTN and HLD Diet controlled currently DCed glipizide in the past to avoid hypoglycemia Advised to follow diabetic diet On statin and ACEi Reviewed CMP and lipid panel Diabetic eye exam: Advised to follow up with Ophthalmology for diabetic eye exam

## 2022-09-06 NOTE — Assessment & Plan Note (Signed)
MMSE: 26 Has mild cognitive decline with short-term memory loss On donepezil 5 mg qHS Advised to wear hearing aids as hearing loss can impair cognition as well 

## 2022-09-06 NOTE — Assessment & Plan Note (Signed)
Although he does not have pain, his leg weakness is likely due to OA of knee and surrounding muscle weakness Tylenol as needed for pain Referred to home health for PT - may benefit from leg strengthening exercises Needs to use cane for walking support

## 2022-09-06 NOTE — Assessment & Plan Note (Signed)
Chronic, but recently worse Likely has OA of knee and age-related muscular atrophy in addition to diabetic neuropathy Needs to use cane for walking support Referred to home PT

## 2022-09-06 NOTE — Assessment & Plan Note (Signed)
BP Readings from Last 1 Encounters:  09/06/22 136/77   Usually well-controlled with Lisinopril Counseled for compliance with the medications Advised DASH diet and moderate exercise/walking as tolerated

## 2022-09-06 NOTE — Patient Instructions (Signed)
Please continue to take medications as prescribed.  Please continue to follow low salt diet and ambulate as tolerated.  Please use cane for walking support.  You are being referred to home health for Physical therapy.

## 2022-09-06 NOTE — Assessment & Plan Note (Signed)
Advised to try Colace as needed Avoid MiraLAX for now as she has loose BM or stool urgency

## 2022-09-06 NOTE — Progress Notes (Signed)
Established Patient Office Visit  Subjective:  Patient ID: Daniel Mercado, male    DOB: [birth year]  Age: 89 y.o. MRN: [id number]  CC:  Chief Complaint  Patient presents with   Hypothyroidism    Four month follow up    Knee Problem    Patient feels his knees are weak and they can give out at any time    HPI Daniel Mercado is a 89 y.o. male with past medical history of DM, BPH, DDD of lumbar spine s/p disc repair and gout who presents for f/u of her chronic medical conditions.  His daughter-in-law is present during the visit today.  Dementia: He is on donepezil for dementia.  He has not had any episodes of agitation, delusion or hallucinations.  He has a hearing aid now, but has excess earwax due to it.  He does not wear hearing aid regularly.  He lives alone.  His son and daughter-in-law live next-door.  He plays cards with his friends at a nearby store.  His BP was wnl today. BP is well-controlled at home. Takes medications regularly. Patient denies headache, dizziness, chest pain, dyspnea or palpitations.  His HbA1C was 6.1 today. He used to take Glipizide, which was stopped to avoid hypoglycemia risk. Denies any polyuria or polyphagia. Does not recall trying any other medication for it.  Constipation: He takes MiraLAX as needed, but has stool urgency or loose BM with it.  Denies any melena or hematochezia currently.   Hypothyroidism: His TSH was elevated, but better compared to prior since increasing dose of levothyroxine to 88 mcg. He denies any recent change in appetite or weight.  He currently takes levothyroxine 88 mcg daily.  CKD: His S. Cr. has increased and GFR had dropped to 43 from 60 in 12/23, but is overall stable at 49 now. He admits that he drinks only 2 glasses of water in a day sometimes, but takes soda pops daily.  He reports right leg weakness and thinks that his knees give out.  He currently denies acute right knee pain.  Denies any recent fall, but reports that he  feels off balance due to RLE weakness.  Denies any numbness or tingling of the LE.   Past Medical History:  Diagnosis Date   Erectile dysfunction    Gout    History of colon polyps    Left ureteral stone    Nocturia    Peripheral neuropathy    Sciatica    Type 2 diabetes mellitus (HCC)     Past Surgical History:  Procedure Laterality Date   COLONOSCOPY  last one 2009   LUMBAR DISC SURGERY  1995  &  1967    Family History  Problem Relation Age of Onset   Diabetes Other     Social History   Socioeconomic History   Marital status: Married    Spouse name: Not on file   Number of children: Not on file   Years of education: Not on file   Highest education level: Not on file  Occupational History   Not on file  Tobacco Use   Smoking status: Former   Smokeless tobacco: Current    Types: Chew  Substance and Sexual Activity   Alcohol use: No   Drug use: No   Sexual activity: Not on file  Other Topics Concern   Not on file  Social History Narrative   Not on file   Social Determinants of Health   Financial Resource Strain: Low  Risk  (09/19/2020)   Overall Financial Resource Strain (CARDIA)    Difficulty of Paying Living Expenses: Not hard at all  Food Insecurity: No Food Insecurity (09/26/2021)   Hunger Vital Sign    Worried About Running Out of Food in the Last Year: Never true    Ran Out of Food in the Last Year: Never true  Transportation Needs: No Transportation Needs (09/26/2021)   PRAPARE - Administrator, Civil Service (Medical): No    Lack of Transportation (Non-Medical): No  Physical Activity: Inactive (09/26/2021)   Exercise Vital Sign    Days of Exercise per Week: 0 days    Minutes of Exercise per Session: 0 min  Stress: No Stress Concern Present (09/19/2020)   [employer] of Occupational Health - Occupational Stress Questionnaire    Feeling of Stress : Not at all  Social Connections: Moderately Isolated (09/26/2021)   Social Connection  and Isolation Panel [NHANES]    Frequency of Communication with Friends and Family: More than three times a week    Frequency of Social Gatherings with Friends and Family: More than three times a week    Attends Religious Services: More than 4 times per year    Active Member of [employer] or Organizations: No    Attends Banker Meetings: Never    Marital Status: Widowed  Intimate Partner Violence: Not At Risk (09/19/2020)   Humiliation, Afraid, Rape, and Kick questionnaire    Fear of Current or Ex-Partner: No    Emotionally Abused: No    Physically Abused: No    Sexually Abused: No    Outpatient Medications Prior to Visit  Medication Sig Dispense Refill   allopurinol (ZYLOPRIM) 100 MG tablet Take 1 tablet (100 mg total) by mouth daily. 90 tablet 1   aspirin 81 MG tablet Take 81 mg by mouth daily.     docusate sodium (COLACE) 100 MG capsule Take 1 capsule (100 mg total) by mouth daily as needed for mild constipation. 30 capsule 5   donepezil (ARICEPT) 5 MG tablet Take 1 tablet (5 mg total) by mouth at bedtime. 90 tablet 1   levothyroxine (SYNTHROID) 88 MCG tablet Take 1 tablet (88 mcg total) by mouth daily. 90 tablet 1   lisinopril (ZESTRIL) 5 MG tablet Take 1 tablet (5 mg total) by mouth daily. 90 tablet 1   polyethylene glycol powder (GLYCOLAX/MIRALAX) 17 GM/SCOOP powder Take 17 g by mouth daily as needed for mild constipation or moderate constipation.      tamsulosin (FLOMAX) 0.4 MG CAPS capsule Take 2 capsules (0.8 mg total) by mouth daily. 180 capsule 1   No facility-administered medications prior to visit.    No Known Allergies  ROS Review of Systems  Constitutional:  Negative for chills and fever.  HENT:  Positive for hearing loss. Negative for congestion and sore throat.   Eyes:  Negative for pain and discharge.  Respiratory:  Negative for cough and shortness of breath.   Cardiovascular:  Negative for chest pain and palpitations.  Gastrointestinal:  Positive for  constipation. Negative for diarrhea, nausea and vomiting.  Endocrine: Negative for polydipsia and polyuria.  Genitourinary:  Negative for dysuria and hematuria.  Musculoskeletal:  Positive for gait problem. Negative for neck pain and neck stiffness.  Skin:  Negative for rash.  Neurological:  Negative for dizziness, weakness, numbness and headaches.  Psychiatric/Behavioral:  Negative for agitation and behavioral problems. The patient is nervous/anxious.       Objective:  Physical Exam Vitals reviewed.  Constitutional:      General: He is not in acute distress.    Appearance: He is not diaphoretic.  HENT:     Head: Normocephalic and atraumatic.     Nose: Nose normal.     Mouth/Throat:     Mouth: Mucous membranes are moist.  Eyes:     General: No scleral icterus.    Extraocular Movements: Extraocular movements intact.  Cardiovascular:     Rate and Rhythm: Normal rate and regular rhythm.     Pulses: Normal pulses.     Heart sounds: Normal heart sounds. No murmur heard. Pulmonary:     Breath sounds: Normal breath sounds. No wheezing or rales.  Musculoskeletal:     Cervical back: Neck supple. No tenderness.     Right lower leg: No edema.     Left lower leg: No edema.  Skin:    General: Skin is warm.     Findings: Rash (Seborrheic keratosis on forearms - papular rash) present.  Neurological:     General: No focal deficit present.     Mental Status: He is alert and oriented to person, place, and time.     Motor: Weakness (RLE - 4/5) present.     Comments: MMSE: 26  Psychiatric:        Mood and Affect: Mood normal.        Behavior: Behavior normal.     BP 136/77 (BP Location: Left Arm, Patient Position: Sitting, Cuff Size: Normal)   Pulse 64   Ht 6' (1.829 m)   Wt 204 lb 9.6 oz (92.8 kg)   SpO2 94%   BMI 27.75 kg/m  Wt Readings from Last 3 Encounters:  09/06/22 204 lb 9.6 oz (92.8 kg)  05/03/22 201 lb 12.8 oz (91.5 kg)  01/02/22 190 lb 9.6 oz (86.5 kg)    Lab  Results  Component Value Date   TSH 5.200 (H) 08/29/2022   Lab Results  Component Value Date   WBC 6.3 12/26/2021   HGB 14.0 12/26/2021   HCT 42.0 12/26/2021   MCV 93 12/26/2021   PLT 265 12/26/2021   Lab Results  Component Value Date   NA 142 08/29/2022   K 4.9 08/29/2022   CO2 21 08/29/2022   GLUCOSE 115 (H) 08/29/2022   BUN 21 08/29/2022   CREATININE 1.40 (H) 08/29/2022   BILITOT 0.4 12/26/2021   ALKPHOS 86 12/26/2021   AST 24 12/26/2021   ALT 18 12/26/2021   PROT 6.7 12/26/2021   ALBUMIN 3.9 12/26/2021   CALCIUM 9.6 08/29/2022   ANIONGAP 7 05/20/2021   EGFR 48 (L) 08/29/2022   Lab Results  Component Value Date   CHOL 159 12/26/2021   Lab Results  Component Value Date   HDL 42 12/26/2021   Lab Results  Component Value Date   LDLCALC 100 (H) 12/26/2021   Lab Results  Component Value Date   TRIG 91 12/26/2021   Lab Results  Component Value Date   CHOLHDL 3.8 12/26/2021   Lab Results  Component Value Date   HGBA1C 6.6 (H) 08/29/2022      Assessment & Plan:   Problem List Items Addressed This Visit       Cardiovascular and Mediastinum   Essential hypertension, benign    BP Readings from Last 1 Encounters:  09/06/22 136/77   Usually well-controlled with Lisinopril Counseled for compliance with the medications Advised DASH diet and moderate exercise/walking as tolerated  Digestive   Chronic idiopathic constipation    Advised to try Colace as needed Avoid MiraLAX for now as she has loose BM or stool urgency        Endocrine   Type 2 diabetes mellitus with other specified complication (HCC) - Primary    Lab Results  Component Value Date   HGBA1C 6.6 (H) 08/29/2022   Associated with HTN and HLD Diet controlled currently DCed glipizide in the past to avoid hypoglycemia Advised to follow diabetic diet On statin and ACEi Reviewed CMP and lipid panel Diabetic eye exam: Advised to follow up with Ophthalmology for diabetic eye  exam      Relevant Orders   CMP14+EGFR   Hemoglobin A1c   Hypothyroidism    Lab Results  Component Value Date   TSH 5.200 (H) 08/29/2022   On levothyroxine 88 mcg As TSH improved, will continue same dose of Levothyroxine for now Check TSH and free T4      Relevant Orders   TSH + free T4     Nervous and Auditory   Mild Alzheimer's dementia without behavioral disturbance, psychotic disturbance, mood disturbance, or anxiety (HCC)    MMSE: 26 Has mild cognitive decline with short-term memory loss On donepezil 5 mg qHS Advised to wear hearing aids as hearing loss can impair cognition as well      Relevant Orders   Ambulatory referral to Home Health   Weakness of right lower extremity    Chronic, but recently worse Likely has OA of knee and age-related muscular atrophy in addition to diabetic neuropathy Needs to use cane for walking support Referred to home PT      Relevant Orders   Ambulatory referral to Home Health     Musculoskeletal and Integument   Primary osteoarthritis of right knee    Although he does not have pain, his leg weakness is likely due to OA of knee and surrounding muscle weakness Tylenol as needed for pain Referred to home health for PT - may benefit from leg strengthening exercises Needs to use cane for walking support      Relevant Orders   Ambulatory referral to Home Health     Genitourinary   Stage 3a chronic kidney disease (HCC)    Last BMP reviewed, GFR stable around 45 Maintain adequate hydration Avoid nephrotoxic agents If persistent drop in GFR, switch from lisinopril to amlodipine      Relevant Orders   CBC with Differential/Platelet   CMP14+EGFR    No orders of the defined types were placed in this encounter.   Follow-up: Return in about 4 months (around 01/06/2023) for CKD, HTN and DM.    Anabel Halon, MD

## 2022-09-06 NOTE — Assessment & Plan Note (Addendum)
Lab Results  Component Value Date   TSH 5.200 (H) 08/29/2022   On levothyroxine 88 mcg As TSH improved, will continue same dose of Levothyroxine for now Check TSH and free T4

## 2022-09-06 NOTE — Assessment & Plan Note (Addendum)
Last BMP reviewed, GFR stable around 45 Maintain adequate hydration Avoid nephrotoxic agents If persistent drop in GFR, switch from lisinopril to amlodipine

## 2022-09-11 DIAGNOSIS — E1122 Type 2 diabetes mellitus with diabetic chronic kidney disease: Secondary | ICD-10-CM | POA: Diagnosis not present

## 2022-09-11 DIAGNOSIS — G309 Alzheimer's disease, unspecified: Secondary | ICD-10-CM | POA: Diagnosis not present

## 2022-09-11 DIAGNOSIS — M5430 Sciatica, unspecified side: Secondary | ICD-10-CM | POA: Diagnosis not present

## 2022-09-11 DIAGNOSIS — N400 Enlarged prostate without lower urinary tract symptoms: Secondary | ICD-10-CM | POA: Diagnosis not present

## 2022-09-11 DIAGNOSIS — Z86010 Personal history of colonic polyps: Secondary | ICD-10-CM | POA: Diagnosis not present

## 2022-09-11 DIAGNOSIS — E1159 Type 2 diabetes mellitus with other circulatory complications: Secondary | ICD-10-CM | POA: Diagnosis not present

## 2022-09-11 DIAGNOSIS — E1169 Type 2 diabetes mellitus with other specified complication: Secondary | ICD-10-CM | POA: Diagnosis not present

## 2022-09-11 DIAGNOSIS — M47816 Spondylosis without myelopathy or radiculopathy, lumbar region: Secondary | ICD-10-CM | POA: Diagnosis not present

## 2022-09-11 DIAGNOSIS — E039 Hypothyroidism, unspecified: Secondary | ICD-10-CM | POA: Diagnosis not present

## 2022-09-11 DIAGNOSIS — Z7982 Long term (current) use of aspirin: Secondary | ICD-10-CM | POA: Diagnosis not present

## 2022-09-11 DIAGNOSIS — E785 Hyperlipidemia, unspecified: Secondary | ICD-10-CM | POA: Diagnosis not present

## 2022-09-11 DIAGNOSIS — I152 Hypertension secondary to endocrine disorders: Secondary | ICD-10-CM | POA: Diagnosis not present

## 2022-09-11 DIAGNOSIS — Z87891 Personal history of nicotine dependence: Secondary | ICD-10-CM | POA: Diagnosis not present

## 2022-09-11 DIAGNOSIS — F02A0 Dementia in other diseases classified elsewhere, mild, without behavioral disturbance, psychotic disturbance, mood disturbance, and anxiety: Secondary | ICD-10-CM | POA: Diagnosis not present

## 2022-09-11 DIAGNOSIS — M1711 Unilateral primary osteoarthritis, right knee: Secondary | ICD-10-CM | POA: Diagnosis not present

## 2022-09-11 DIAGNOSIS — M109 Gout, unspecified: Secondary | ICD-10-CM | POA: Diagnosis not present

## 2022-09-11 DIAGNOSIS — K5904 Chronic idiopathic constipation: Secondary | ICD-10-CM | POA: Diagnosis not present

## 2022-09-11 DIAGNOSIS — E1142 Type 2 diabetes mellitus with diabetic polyneuropathy: Secondary | ICD-10-CM | POA: Diagnosis not present

## 2022-09-11 DIAGNOSIS — N529 Male erectile dysfunction, unspecified: Secondary | ICD-10-CM | POA: Diagnosis not present

## 2022-09-11 DIAGNOSIS — N1831 Chronic kidney disease, stage 3a: Secondary | ICD-10-CM | POA: Diagnosis not present

## 2022-09-21 DIAGNOSIS — N1831 Chronic kidney disease, stage 3a: Secondary | ICD-10-CM | POA: Diagnosis not present

## 2022-09-21 DIAGNOSIS — M1711 Unilateral primary osteoarthritis, right knee: Secondary | ICD-10-CM | POA: Diagnosis not present

## 2022-09-21 DIAGNOSIS — F02A0 Dementia in other diseases classified elsewhere, mild, without behavioral disturbance, psychotic disturbance, mood disturbance, and anxiety: Secondary | ICD-10-CM | POA: Diagnosis not present

## 2022-09-21 DIAGNOSIS — E1122 Type 2 diabetes mellitus with diabetic chronic kidney disease: Secondary | ICD-10-CM | POA: Diagnosis not present

## 2022-09-21 DIAGNOSIS — M47816 Spondylosis without myelopathy or radiculopathy, lumbar region: Secondary | ICD-10-CM | POA: Diagnosis not present

## 2022-09-21 DIAGNOSIS — G309 Alzheimer's disease, unspecified: Secondary | ICD-10-CM | POA: Diagnosis not present

## 2022-09-26 DIAGNOSIS — M1711 Unilateral primary osteoarthritis, right knee: Secondary | ICD-10-CM | POA: Diagnosis not present

## 2022-09-26 DIAGNOSIS — M47816 Spondylosis without myelopathy or radiculopathy, lumbar region: Secondary | ICD-10-CM | POA: Diagnosis not present

## 2022-09-26 DIAGNOSIS — G309 Alzheimer's disease, unspecified: Secondary | ICD-10-CM | POA: Diagnosis not present

## 2022-09-26 DIAGNOSIS — E1122 Type 2 diabetes mellitus with diabetic chronic kidney disease: Secondary | ICD-10-CM | POA: Diagnosis not present

## 2022-09-26 DIAGNOSIS — N1831 Chronic kidney disease, stage 3a: Secondary | ICD-10-CM | POA: Diagnosis not present

## 2022-09-26 DIAGNOSIS — F02A0 Dementia in other diseases classified elsewhere, mild, without behavioral disturbance, psychotic disturbance, mood disturbance, and anxiety: Secondary | ICD-10-CM | POA: Diagnosis not present

## 2022-10-03 DIAGNOSIS — N1831 Chronic kidney disease, stage 3a: Secondary | ICD-10-CM | POA: Diagnosis not present

## 2022-10-03 DIAGNOSIS — M1711 Unilateral primary osteoarthritis, right knee: Secondary | ICD-10-CM | POA: Diagnosis not present

## 2022-10-03 DIAGNOSIS — G309 Alzheimer's disease, unspecified: Secondary | ICD-10-CM | POA: Diagnosis not present

## 2022-10-03 DIAGNOSIS — F02A0 Dementia in other diseases classified elsewhere, mild, without behavioral disturbance, psychotic disturbance, mood disturbance, and anxiety: Secondary | ICD-10-CM | POA: Diagnosis not present

## 2022-10-03 DIAGNOSIS — E1122 Type 2 diabetes mellitus with diabetic chronic kidney disease: Secondary | ICD-10-CM | POA: Diagnosis not present

## 2022-10-03 DIAGNOSIS — M47816 Spondylosis without myelopathy or radiculopathy, lumbar region: Secondary | ICD-10-CM | POA: Diagnosis not present

## 2022-10-11 DIAGNOSIS — M47816 Spondylosis without myelopathy or radiculopathy, lumbar region: Secondary | ICD-10-CM | POA: Diagnosis not present

## 2022-10-11 DIAGNOSIS — F02A0 Dementia in other diseases classified elsewhere, mild, without behavioral disturbance, psychotic disturbance, mood disturbance, and anxiety: Secondary | ICD-10-CM | POA: Diagnosis not present

## 2022-10-11 DIAGNOSIS — E785 Hyperlipidemia, unspecified: Secondary | ICD-10-CM | POA: Diagnosis not present

## 2022-10-11 DIAGNOSIS — E1169 Type 2 diabetes mellitus with other specified complication: Secondary | ICD-10-CM | POA: Diagnosis not present

## 2022-10-11 DIAGNOSIS — N529 Male erectile dysfunction, unspecified: Secondary | ICD-10-CM | POA: Diagnosis not present

## 2022-10-11 DIAGNOSIS — M109 Gout, unspecified: Secondary | ICD-10-CM | POA: Diagnosis not present

## 2022-10-11 DIAGNOSIS — I152 Hypertension secondary to endocrine disorders: Secondary | ICD-10-CM | POA: Diagnosis not present

## 2022-10-11 DIAGNOSIS — E1122 Type 2 diabetes mellitus with diabetic chronic kidney disease: Secondary | ICD-10-CM | POA: Diagnosis not present

## 2022-10-11 DIAGNOSIS — Z87891 Personal history of nicotine dependence: Secondary | ICD-10-CM | POA: Diagnosis not present

## 2022-10-11 DIAGNOSIS — E039 Hypothyroidism, unspecified: Secondary | ICD-10-CM | POA: Diagnosis not present

## 2022-10-11 DIAGNOSIS — K5904 Chronic idiopathic constipation: Secondary | ICD-10-CM | POA: Diagnosis not present

## 2022-10-11 DIAGNOSIS — M1711 Unilateral primary osteoarthritis, right knee: Secondary | ICD-10-CM | POA: Diagnosis not present

## 2022-10-11 DIAGNOSIS — N1831 Chronic kidney disease, stage 3a: Secondary | ICD-10-CM | POA: Diagnosis not present

## 2022-10-11 DIAGNOSIS — E1142 Type 2 diabetes mellitus with diabetic polyneuropathy: Secondary | ICD-10-CM | POA: Diagnosis not present

## 2022-10-11 DIAGNOSIS — G309 Alzheimer's disease, unspecified: Secondary | ICD-10-CM | POA: Diagnosis not present

## 2022-10-11 DIAGNOSIS — M5430 Sciatica, unspecified side: Secondary | ICD-10-CM | POA: Diagnosis not present

## 2022-10-11 DIAGNOSIS — Z86010 Personal history of colonic polyps: Secondary | ICD-10-CM | POA: Diagnosis not present

## 2022-10-11 DIAGNOSIS — Z7982 Long term (current) use of aspirin: Secondary | ICD-10-CM | POA: Diagnosis not present

## 2022-10-11 DIAGNOSIS — N400 Enlarged prostate without lower urinary tract symptoms: Secondary | ICD-10-CM | POA: Diagnosis not present

## 2022-10-11 DIAGNOSIS — E1159 Type 2 diabetes mellitus with other circulatory complications: Secondary | ICD-10-CM | POA: Diagnosis not present

## 2022-10-16 DIAGNOSIS — G309 Alzheimer's disease, unspecified: Secondary | ICD-10-CM | POA: Diagnosis not present

## 2022-10-16 DIAGNOSIS — M1711 Unilateral primary osteoarthritis, right knee: Secondary | ICD-10-CM | POA: Diagnosis not present

## 2022-10-16 DIAGNOSIS — M47816 Spondylosis without myelopathy or radiculopathy, lumbar region: Secondary | ICD-10-CM | POA: Diagnosis not present

## 2022-10-16 DIAGNOSIS — E1122 Type 2 diabetes mellitus with diabetic chronic kidney disease: Secondary | ICD-10-CM | POA: Diagnosis not present

## 2022-10-16 DIAGNOSIS — N1831 Chronic kidney disease, stage 3a: Secondary | ICD-10-CM | POA: Diagnosis not present

## 2022-10-16 DIAGNOSIS — F02A0 Dementia in other diseases classified elsewhere, mild, without behavioral disturbance, psychotic disturbance, mood disturbance, and anxiety: Secondary | ICD-10-CM | POA: Diagnosis not present

## 2022-10-23 ENCOUNTER — Ambulatory Visit (INDEPENDENT_AMBULATORY_CARE_PROVIDER_SITE_OTHER): Payer: Medicare Other

## 2022-10-23 VITALS — Ht 72.0 in | Wt 200.0 lb

## 2022-10-23 DIAGNOSIS — Z0000 Encounter for general adult medical examination without abnormal findings: Secondary | ICD-10-CM

## 2022-10-23 NOTE — Progress Notes (Signed)
Subjective:   Daniel Mercado is a 89 y.o. male who presents for Medicare Annual/Subsequent preventive examination.  Visit Complete: Virtual  I connected with  Daniel Mercado on 10/23/22 by a audio enabled telemedicine application and verified that I am speaking with the correct person using two identifiers.  Patient Location: Home  Provider Location: Home Office  I discussed the limitations of evaluation and management by telemedicine. The patient expressed understanding and agreed to proceed.  Patient Medicare AWV questionnaire was completed by the patient on 10/23/2022; I have confirmed that all information answered by patient is correct and no changes since this date.  Cardiac Risk Factors include: advanced age (>5men, >88 women);male gender;dyslipidemia;hypertensionBecause this visit was a virtual/telehealth visit, some criteria may be missing or patient reported. Any vitals not documented were not able to be obtained and vitals that have been documented are patient reported.       Objective:    Today's Vitals   10/23/22 1033  Weight: 200 lb (90.7 kg)  Height: 6' (1.829 m)   Body mass index is 27.12 kg/m.     10/23/2022   10:36 AM 05/20/2021    8:28 PM 09/19/2020    8:07 AM 11/06/2014    9:32 AM 10/09/2014    3:35 PM  Advanced Directives  Does Patient Have a Medical Advance Directive? Yes No Yes Yes Yes  Type of Estate agent of [place];Living will  Living will Living will Healthcare Power of Attorney  Does patient want to make changes to medical advance directive?   No - Patient declined    Copy of Healthcare Power of Attorney in Chart? No - copy requested   No - copy requested   Would patient like information on creating a medical advance directive?    No - patient declined information     Current Medications (verified) Outpatient Encounter Medications as of 10/23/2022  Medication Sig   allopurinol (ZYLOPRIM) 100 MG tablet Take 1 tablet (100 mg  total) by mouth daily.   aspirin 81 MG tablet Take 81 mg by mouth daily.   docusate sodium (COLACE) 100 MG capsule Take 1 capsule (100 mg total) by mouth daily as needed for mild constipation.   donepezil (ARICEPT) 5 MG tablet Take 1 tablet (5 mg total) by mouth at bedtime.   levothyroxine (SYNTHROID) 88 MCG tablet Take 1 tablet (88 mcg total) by mouth daily.   lisinopril (ZESTRIL) 5 MG tablet Take 1 tablet (5 mg total) by mouth daily.   polyethylene glycol powder (GLYCOLAX/MIRALAX) 17 GM/SCOOP powder Take 17 g by mouth daily as needed for mild constipation or moderate constipation.    tamsulosin (FLOMAX) 0.4 MG CAPS capsule Take 2 capsules (0.8 mg total) by mouth daily.   No facility-administered encounter medications on file as of 10/23/2022.    Allergies (verified) Patient has no known allergies.   History: Past Medical History:  Diagnosis Date   Erectile dysfunction    Gout    History of colon polyps    Left ureteral stone    Nocturia    Peripheral neuropathy    Sciatica    Type 2 diabetes mellitus (HCC)    Past Surgical History:  Procedure Laterality Date   COLONOSCOPY  last one 2009   LUMBAR DISC SURGERY  1995  &  1967   Family History  Problem Relation Age of Onset   Diabetes Other    Social History   Socioeconomic History   Marital status: Married  Spouse name: Not on file   Number of children: Not on file   Years of education: Not on file   Highest education level: Not on file  Occupational History   Not on file  Tobacco Use   Smoking status: Former   Smokeless tobacco: Current    Types: Chew  Substance and Sexual Activity   Alcohol use: No   Drug use: No   Sexual activity: Not on file  Other Topics Concern   Not on file  Social History Narrative   Not on file   Social Determinants of Health   Financial Resource Strain: Low Risk  (10/23/2022)   Overall Financial Resource Strain (CARDIA)    Difficulty of Paying Living Expenses: Not hard at all   Food Insecurity: No Food Insecurity (10/23/2022)   Hunger Vital Sign    Worried About Running Out of Food in the Last Year: Never true    Ran Out of Food in the Last Year: Never true  Transportation Needs: No Transportation Needs (10/23/2022)   PRAPARE - Administrator, Civil Service (Medical): No    Lack of Transportation (Non-Medical): No  Physical Activity: Insufficiently Active (10/23/2022)   Exercise Vital Sign    Days of Exercise per Week: 3 days    Minutes of Exercise per Session: 30 min  Stress: No Stress Concern Present (10/23/2022)   [employer] of Occupational Health - Occupational Stress Questionnaire    Feeling of Stress : Not at all  Social Connections: Moderately Isolated (10/23/2022)   Social Connection and Isolation Panel [NHANES]    Frequency of Communication with Friends and Family: More than three times a week    Frequency of Social Gatherings with Friends and Family: More than three times a week    Attends Religious Services: More than 4 times per year    Active Member of [employer] or Organizations: No    Attends Banker Meetings: Never    Marital Status: Widowed    Tobacco Counseling Ready to quit: Not Answered Counseling given: Not Answered   Clinical Intake:  Pre-visit preparation completed: Yes  Pain : No/denies pain     Nutritional Risks: None Diabetes: No  How often do you need to have someone help you when you read instructions, pamphlets, or other written materials from your doctor or pharmacy?: 1 - Never  Interpreter Needed?: No  Information entered by :: Renie Ora, LPN   Activities of Daily Living    10/23/2022   10:36 AM  In your present state of health, do you have any difficulty performing the following activities:  Hearing? 0  Vision? 0  Difficulty concentrating or making decisions? 0  Walking or climbing stairs? 0  Dressing or bathing? 0  Doing errands, shopping? 0  Preparing Food and eating ? N   Using the Toilet? N  In the past six months, have you accidently leaked urine? N  Do you have problems with loss of bowel control? N  Managing your Medications? N  Managing your Finances? N  Housekeeping or managing your Housekeeping? N    Patient Care Team: Anabel Halon, MD as PCP - General (Internal Medicine)  Indicate any recent Medical Services you may have received from other than [facility] providers in the past year (date may be approximate).     Assessment:   This is a routine wellness examination for Daniel Mercado.  Hearing/Vision screen Vision Screening - Comments:: Wears rx glasses - up to date with routine  eye exams with  Dr. Micheal Likens   Goals Addressed             This Visit's Progress    Patient Stated   On track    Would like to move more       Depression Screen    10/23/2022   10:35 AM 09/06/2022    8:45 AM 05/03/2022    8:35 AM 01/02/2022    8:36 AM 09/01/2021    8:24 AM 05/02/2021    8:28 AM 12/31/2020    8:27 AM  PHQ 2/9 Scores  PHQ - 2 Score 0 0 2 2 0 0 0  PHQ- 9 Score   6 4       Fall Risk    10/23/2022   10:34 AM 09/06/2022    8:45 AM 05/03/2022    8:35 AM 01/02/2022    8:36 AM 09/26/2021    3:59 PM  Fall Risk   Falls in the past year? 0 0 1 1 1   Number falls in past yr: 0 0 0 0 1  Injury with Fall? 0 0 1 1 1   Risk for fall due to : No Fall Risks      Follow up Falls prevention discussed        MEDICARE RISK AT HOME: Medicare Risk at Home Any stairs in or around the home?: No If so, are there any without handrails?: No Home free of loose throw rugs in walkways, pet beds, electrical cords, etc?: Yes Adequate lighting in your home to reduce risk of falls?: Yes Life alert?: No Use of a cane, walker or w/c?: No Grab bars in the bathroom?: Yes Shower chair or bench in shower?: Yes Elevated toilet seat or a handicapped toilet?: Yes  TIMED UP AND GO:  Was the test performed?  No    Cognitive Function:    05/02/2021    9:12 AM 09/19/2020     8:10 AM 06/03/2019   10:07 AM  MMSE - Mini Mental State Exam  Not completed:  Unable to complete   Orientation to time 4  5  Orientation to Place 5  5  Registration 2  3  Attention/ Calculation 5  3  Recall 2  2  Language- name 2 objects 2    Language- repeat 1    Language- follow 3 step command 3    Language- read & follow direction 1    Write a sentence 1    Copy design 0    Total score 26          10/23/2022   10:37 AM 09/26/2021    4:09 PM 09/19/2020    8:10 AM  6CIT Screen  What Year? 0 points 0 points 0 points  What month? 3 points 0 points 0 points  What time? 0 points 0 points 0 points  Count back from 20 0 points  0 points  Months in reverse 0 points  0 points  Repeat phrase 2 points  0 points  Total Score 5 points  0 points    Immunizations Immunization History  Administered Date(s) Administered   Fluad Quad(high Dose 65+) 11/20/2019, 11/03/2020, 01/02/2022   Influenza Split 01/02/2013   Influenza, High Dose Seasonal PF 10/29/2017   Influenza,inj,Quad PF,6+ Mos 11/18/2013, 12/24/2014, 12/28/2016   Influenza-Unspecified 11/29/2011, 11/16/2015, 10/22/2017   Moderna Sars-Covid-2 Vaccination 11/10/2019   PNEUMOCOCCAL CONJUGATE-20 09/01/2021   Pneumococcal Conjugate-13 01/01/2014   Pneumococcal-Unspecified 01/15/2010   Respiratory Syncytial Virus Vaccine,Recomb Aduvanted(Arexvy) 09/26/2021  Tdap 11/16/2015   Zoster Recombinant(Shingrix) 09/26/2021, 01/12/2022    TDAP status: Up to date  Flu Vaccine status: Up to date  Pneumococcal vaccine status: Up to date  Covid-19 vaccine status: Completed vaccines  Qualifies for Shingles Vaccine? Yes   Zostavax completed Yes   Shingrix Completed?: Yes  Screening Tests Health Maintenance  Topic Date Due   INFLUENZA VACCINE  08/17/2022   COVID-19 Vaccine (2 - 2023-24 season) 09/17/2022   FOOT EXAM  01/03/2023   HEMOGLOBIN A1C  03/01/2023   OPHTHALMOLOGY EXAM  05/18/2023   Medicare Annual Wellness (AWV)   10/23/2023   DTaP/Tdap/Td (2 - Td or Tdap) 11/15/2025   Pneumonia Vaccine 53+ Years old  Completed   Zoster Vaccines- Shingrix  Completed   HPV VACCINES  Aged Out    Health Maintenance  Health Maintenance Due  Topic Date Due   INFLUENZA VACCINE  08/17/2022   COVID-19 Vaccine (2 - 2023-24 season) 09/17/2022    Colorectal cancer screening: No longer required.   Lung Cancer Screening: (Low Dose CT Chest recommended if Age 78-80 years, 20 pack-year currently smoking OR have quit w/in 15years.) does not qualify.   Lung Cancer Screening Referral: n/a  Additional Screening:  Hepatitis C Screening: does not qualify;   Vision Screening: Recommended annual ophthalmology exams for early detection of glaucoma and other disorders of the eye. Is the patient up to date with their annual eye exam?  Yes  Who is the provider or what is the name of the office in which the patient attends annual eye exams? Dr.Shiprio  If pt is not established with a provider, would they like to be referred to a provider to establish care? No .   Dental Screening: Recommended annual dental exams for proper oral hygiene   Community Resource Referral / Chronic Care Management: CRR required this visit?  No   CCM required this visit?  No     Plan:     I have personally reviewed and noted the following in the patient's chart:   Medical and social history Use of alcohol, tobacco or illicit drugs  Current medications and supplements including opioid prescriptions. Patient is not currently taking opioid prescriptions. Functional ability and status Nutritional status Physical activity Advanced directives List of other physicians Hospitalizations, surgeries, and ER visits in previous 12 months Vitals Screenings to include cognitive, depression, and falls Referrals and appointments  In addition, I have reviewed and discussed with patient certain preventive protocols, quality metrics, and best practice  recommendations. A written personalized care plan for preventive services as well as general preventive health recommendations were provided to patient.     Lorrene Reid, LPN   [id number]   After Visit Summary: (MyChart) Due to this being a telephonic visit, the after visit summary with patients personalized plan was offered to patient via MyChart   Nurse Notes: none

## 2022-10-23 NOTE — Patient Instructions (Signed)
Daniel Mercado , Thank you for taking time to come for your Medicare Wellness Visit. I appreciate your ongoing commitment to your health goals. Please review the following plan we discussed and let me know if I can assist you in the future.   Referrals/Orders/Follow-Ups/Clinician Recommendations: Aim for 30 minutes of exercise or brisk walking, 6-8 glasses of water, and 5 servings of fruits and vegetables each day.   This is a list of the screening recommended for you and due dates:  Health Maintenance  Topic Date Due   Flu Shot  08/17/2022   COVID-19 Vaccine (2 - 2023-24 season) 09/17/2022   Complete foot exam   01/03/2023   Hemoglobin A1C  03/01/2023   Eye exam for diabetics  05/18/2023   Medicare Annual Wellness Visit  10/23/2023   DTaP/Tdap/Td vaccine (2 - Td or Tdap) 11/15/2025   Pneumonia Vaccine  Completed   Zoster (Shingles) Vaccine  Completed   HPV Vaccine  Aged Out    Advanced directives: (Copy Requested) Please bring a copy of your health care power of attorney and living will to the office to be added to your chart at your convenience.  Next Medicare Annual Wellness Visit scheduled for next year: Yes  insert Preventive Care attachment Insert FALL PREVENTION attachment if needed

## 2022-10-31 DIAGNOSIS — E1122 Type 2 diabetes mellitus with diabetic chronic kidney disease: Secondary | ICD-10-CM | POA: Diagnosis not present

## 2022-10-31 DIAGNOSIS — M1711 Unilateral primary osteoarthritis, right knee: Secondary | ICD-10-CM | POA: Diagnosis not present

## 2022-10-31 DIAGNOSIS — M47816 Spondylosis without myelopathy or radiculopathy, lumbar region: Secondary | ICD-10-CM | POA: Diagnosis not present

## 2022-10-31 DIAGNOSIS — G309 Alzheimer's disease, unspecified: Secondary | ICD-10-CM | POA: Diagnosis not present

## 2022-10-31 DIAGNOSIS — N1831 Chronic kidney disease, stage 3a: Secondary | ICD-10-CM | POA: Diagnosis not present

## 2022-10-31 DIAGNOSIS — F02A0 Dementia in other diseases classified elsewhere, mild, without behavioral disturbance, psychotic disturbance, mood disturbance, and anxiety: Secondary | ICD-10-CM | POA: Diagnosis not present

## 2022-11-07 DIAGNOSIS — M47816 Spondylosis without myelopathy or radiculopathy, lumbar region: Secondary | ICD-10-CM | POA: Diagnosis not present

## 2022-11-07 DIAGNOSIS — F02A0 Dementia in other diseases classified elsewhere, mild, without behavioral disturbance, psychotic disturbance, mood disturbance, and anxiety: Secondary | ICD-10-CM | POA: Diagnosis not present

## 2022-11-07 DIAGNOSIS — G309 Alzheimer's disease, unspecified: Secondary | ICD-10-CM | POA: Diagnosis not present

## 2022-11-07 DIAGNOSIS — M1711 Unilateral primary osteoarthritis, right knee: Secondary | ICD-10-CM | POA: Diagnosis not present

## 2022-11-07 DIAGNOSIS — E1122 Type 2 diabetes mellitus with diabetic chronic kidney disease: Secondary | ICD-10-CM | POA: Diagnosis not present

## 2022-11-07 DIAGNOSIS — N1831 Chronic kidney disease, stage 3a: Secondary | ICD-10-CM | POA: Diagnosis not present

## 2022-11-13 ENCOUNTER — Other Ambulatory Visit: Payer: Self-pay | Admitting: Internal Medicine

## 2022-11-13 DIAGNOSIS — E039 Hypothyroidism, unspecified: Secondary | ICD-10-CM

## 2022-11-14 DIAGNOSIS — Z23 Encounter for immunization: Secondary | ICD-10-CM | POA: Diagnosis not present

## 2022-12-07 ENCOUNTER — Other Ambulatory Visit: Payer: Self-pay | Admitting: Internal Medicine

## 2022-12-07 DIAGNOSIS — M1000 Idiopathic gout, unspecified site: Secondary | ICD-10-CM

## 2022-12-07 DIAGNOSIS — N400 Enlarged prostate without lower urinary tract symptoms: Secondary | ICD-10-CM

## 2022-12-25 DIAGNOSIS — N1831 Chronic kidney disease, stage 3a: Secondary | ICD-10-CM | POA: Diagnosis not present

## 2022-12-25 DIAGNOSIS — E039 Hypothyroidism, unspecified: Secondary | ICD-10-CM | POA: Diagnosis not present

## 2022-12-25 DIAGNOSIS — E1169 Type 2 diabetes mellitus with other specified complication: Secondary | ICD-10-CM | POA: Diagnosis not present

## 2022-12-26 LAB — CMP14+EGFR
ALT: 15 [IU]/L (ref 0–44)
AST: 24 [IU]/L (ref 0–40)
Albumin: 4.2 g/dL (ref 3.7–4.7)
Alkaline Phosphatase: 75 [IU]/L (ref 44–121)
BUN/Creatinine Ratio: 16 (ref 10–24)
BUN: 19 mg/dL (ref 8–27)
Bilirubin Total: 0.6 mg/dL (ref 0.0–1.2)
CO2: 23 mmol/L (ref 20–29)
Calcium: 9.8 mg/dL (ref 8.6–10.2)
Chloride: 101 mmol/L (ref 96–106)
Creatinine, Ser: 1.17 mg/dL (ref 0.76–1.27)
Globulin, Total: 2.8 g/dL (ref 1.5–4.5)
Glucose: 133 mg/dL — ABNORMAL HIGH (ref 70–99)
Potassium: 4.7 mmol/L (ref 3.5–5.2)
Sodium: 140 mmol/L (ref 134–144)
Total Protein: 7 g/dL (ref 6.0–8.5)
eGFR: 60 mL/min/{1.73_m2} (ref >59)

## 2022-12-26 LAB — CBC WITH DIFFERENTIAL/PLATELET
Basophils Absolute: 0.1 10*3/uL (ref 0.0–0.2)
Basos: 1 %
EOS (ABSOLUTE): 0.1 10*3/uL (ref 0.0–0.4)
Eos: 1 %
Hematocrit: 44.8 % (ref 37.5–51.0)
Hemoglobin: 15 g/dL (ref 13.0–17.7)
Immature Grans (Abs): 0 10*3/uL (ref 0.0–0.1)
Immature Granulocytes: 0 %
Lymphocytes Absolute: 2.5 10*3/uL (ref 0.7–3.1)
Lymphs: 30 %
MCH: 31.8 pg (ref 26.6–33.0)
MCHC: 33.5 g/dL (ref 31.5–35.7)
MCV: 95 fL (ref 79–97)
Monocytes Absolute: 0.5 10*3/uL (ref 0.1–0.9)
Monocytes: 7 %
Neutrophils Absolute: 4.9 10*3/uL (ref 1.4–7.0)
Neutrophils: 61 %
Platelets: 201 10*3/uL (ref 150–450)
RBC: 4.71 x10E6/uL (ref 4.14–5.80)
RDW: 13.2 % (ref 11.6–15.4)
WBC: 8.1 10*3/uL (ref 3.4–10.8)

## 2022-12-26 LAB — HEMOGLOBIN A1C
Est. average glucose Bld gHb Est-mCnc: 154 mg/dL
Hgb A1c MFr Bld: 7 % — ABNORMAL HIGH (ref 4.8–5.6)

## 2022-12-26 LAB — TSH+FREE T4
Free T4: 1.13 ng/dL (ref 0.82–1.77)
TSH: 4.5 u[IU]/mL (ref 0.450–4.500)

## 2023-01-02 ENCOUNTER — Encounter: Payer: Self-pay | Admitting: Internal Medicine

## 2023-01-02 ENCOUNTER — Ambulatory Visit (INDEPENDENT_AMBULATORY_CARE_PROVIDER_SITE_OTHER): Payer: Medicare Other | Admitting: Internal Medicine

## 2023-01-02 VITALS — BP 134/62 | HR 68 | Ht 72.0 in | Wt 212.6 lb

## 2023-01-02 DIAGNOSIS — R351 Nocturia: Secondary | ICD-10-CM | POA: Diagnosis not present

## 2023-01-02 DIAGNOSIS — M1000 Idiopathic gout, unspecified site: Secondary | ICD-10-CM

## 2023-01-02 DIAGNOSIS — N1831 Chronic kidney disease, stage 3a: Secondary | ICD-10-CM | POA: Diagnosis not present

## 2023-01-02 DIAGNOSIS — I10 Essential (primary) hypertension: Secondary | ICD-10-CM

## 2023-01-02 DIAGNOSIS — E1169 Type 2 diabetes mellitus with other specified complication: Secondary | ICD-10-CM | POA: Diagnosis not present

## 2023-01-02 DIAGNOSIS — E039 Hypothyroidism, unspecified: Secondary | ICD-10-CM | POA: Diagnosis not present

## 2023-01-02 DIAGNOSIS — G309 Alzheimer's disease, unspecified: Secondary | ICD-10-CM | POA: Diagnosis not present

## 2023-01-02 DIAGNOSIS — N400 Enlarged prostate without lower urinary tract symptoms: Secondary | ICD-10-CM | POA: Diagnosis not present

## 2023-01-02 DIAGNOSIS — I444 Left anterior fascicular block: Secondary | ICD-10-CM | POA: Diagnosis not present

## 2023-01-02 DIAGNOSIS — N401 Enlarged prostate with lower urinary tract symptoms: Secondary | ICD-10-CM | POA: Diagnosis not present

## 2023-01-02 DIAGNOSIS — F02A0 Dementia in other diseases classified elsewhere, mild, without behavioral disturbance, psychotic disturbance, mood disturbance, and anxiety: Secondary | ICD-10-CM

## 2023-01-02 MED ORDER — ALLOPURINOL 100 MG PO TABS: 100.0000 mg | ORAL_TABLET | Freq: Every day | ORAL | 1 refills | Status: DC

## 2023-01-02 MED ORDER — LEVOTHYROXINE SODIUM 88 MCG PO TABS: 88.0000 ug | ORAL_TABLET | Freq: Every day | ORAL | 1 refills | Status: DC

## 2023-01-02 MED ORDER — TAMSULOSIN HCL 0.4 MG PO CAPS: 0.8000 mg | ORAL_CAPSULE | Freq: Every day | ORAL | 3 refills | Status: DC

## 2023-01-02 MED ORDER — LISINOPRIL 5 MG PO TABS: 5.0000 mg | ORAL_TABLET | Freq: Every day | ORAL | 1 refills | Status: DC

## 2023-01-02 MED ORDER — DONEPEZIL HCL 10 MG PO TABS: 10.0000 mg | ORAL_TABLET | Freq: Every day | ORAL | 1 refills | Status: DC

## 2023-01-02 NOTE — Progress Notes (Signed)
Established Patient Office Visit  Subjective:  Patient ID: Daniel Mercado, male    DOB: 11-26-1931  Age: 89 y.o. MRN: [id number]  CC:  Chief Complaint  Patient presents with   Hypertension    Four month follow up    Diabetes    Four month follow up    spells    Patient is having spells of where he will forget people, or will fall asleep or be out of it for a very brief period of time     HPI Daniel Mercado is a 89 y.o. male with past medical history of DM, BPH, DDD of lumbar spine s/p disc repair and gout who presents for f/u of her chronic medical conditions.  His daughter-in-law is present during the visit today.  Dementia: He is on donepezil for dementia. His daughter-in-law mentioned 2 episodes of confusion, one at church and other at a local store. She thinks that he fell asleep in church and was confused for few minutes after waking up. In the other episode, he appeared to have difficulty remembering names at a store. Denies any focal neurologic symptoms, such as numbness, tingling or weakness of the UE or LE.  He has not had any episodes of agitation, delusion or hallucinations.  He has a hearing aid now, but has excess earwax due to it.  He does not wear hearing aid regularly.  He lives alone.  His son and daughter-in-law live next-door.  He plays cards with his friends at a nearby store.  His BP was wnl today. BP is well-controlled at home. Takes medications regularly. Patient denies headache, dizziness, chest pain, dyspnea or palpitations.  His HbA1C was 7.0. He used to take Glipizide, which was stopped to avoid hypoglycemia risk. Denies any polyuria or polyphagia. Does not recall trying any other medication for it.  Constipation: He takes MiraLAX as needed, but has stool urgency or loose BM with it.  Denies any melena or hematochezia currently.   Hypothyroidism: His TSH was boderline elevated, but better compared to prior since increasing dose of levothyroxine to 88 mcg. He  denies any recent change in appetite or weight.  He currently takes levothyroxine 88 mcg daily.  CKD: His S. Cr. and GFR have improved now.  He admits that he used to drink only 2 glasses of water in a day sometimes, but is trying to improve hydration. Takes soda pops daily.    Past Medical History:  Diagnosis Date   Erectile dysfunction    Gout    History of colon polyps    Left ureteral stone    Nocturia    Peripheral neuropathy    Sciatica    Type 2 diabetes mellitus (HCC)     Past Surgical History:  Procedure Laterality Date   COLONOSCOPY  last one 2009   LUMBAR DISC SURGERY  1995  &  1967    Family History  Problem Relation Age of Onset   Diabetes Other     Social History   Socioeconomic History   Marital status: Married    Spouse name: Not on file   Number of children: Not on file   Years of education: Not on file   Highest education level: Not on file  Occupational History   Not on file  Tobacco Use   Smoking status: Former   Smokeless tobacco: Current    Types: Chew  Substance and Sexual Activity   Alcohol use: No   Drug use: No  Sexual activity: Not on file  Other Topics Concern   Not on file  Social History Narrative   Not on file   Social Drivers of Health   Financial Resource Strain: Low Risk  (10/23/2022)   Overall Financial Resource Strain (CARDIA)    Difficulty of Paying Living Expenses: Not hard at all  Food Insecurity: No Food Insecurity (10/23/2022)   Hunger Vital Sign    Worried About Running Out of Food in the Last Year: Never true    Ran Out of Food in the Last Year: Never true  Transportation Needs: No Transportation Needs (10/23/2022)   PRAPARE - Administrator, Civil Service (Medical): No    Lack of Transportation (Non-Medical): No  Physical Activity: Insufficiently Active (10/23/2022)   Exercise Vital Sign    Days of Exercise per Week: 3 days    Minutes of Exercise per Session: 30 min  Stress: No Stress Concern  Present (10/23/2022)   [employer] of Occupational Health - Occupational Stress Questionnaire    Feeling of Stress : Not at all  Social Connections: Moderately Isolated (10/23/2022)   Social Connection and Isolation Panel [NHANES]    Frequency of Communication with Friends and Family: More than three times a week    Frequency of Social Gatherings with Friends and Family: More than three times a week    Attends Religious Services: More than 4 times per year    Active Member of [employer] or Organizations: No    Attends Banker Meetings: Never    Marital Status: Widowed  Intimate Partner Violence: Not At Risk (10/23/2022)   Humiliation, Afraid, Rape, and Kick questionnaire    Fear of Current or Ex-Partner: No    Emotionally Abused: No    Physically Abused: No    Sexually Abused: No    Outpatient Medications Prior to Visit  Medication Sig Dispense Refill   aspirin 81 MG tablet Take 81 mg by mouth daily.     docusate sodium (COLACE) 100 MG capsule Take 1 capsule (100 mg total) by mouth daily as needed for mild constipation. 30 capsule 5   polyethylene glycol powder (GLYCOLAX/MIRALAX) 17 GM/SCOOP powder Take 17 g by mouth daily as needed for mild constipation or moderate constipation.      allopurinol (ZYLOPRIM) 100 MG tablet Take 1 tablet by mouth once daily 90 tablet 0   donepezil (ARICEPT) 5 MG tablet Take 1 tablet (5 mg total) by mouth at bedtime. 90 tablet 1   levothyroxine (SYNTHROID) 88 MCG tablet Take 1 tablet by mouth once daily 90 tablet 0   lisinopril (ZESTRIL) 5 MG tablet Take 1 tablet by mouth once daily 90 tablet 0   tamsulosin (FLOMAX) 0.4 MG CAPS capsule Take 2 capsules by mouth once daily 180 capsule 0   No facility-administered medications prior to visit.    No Known Allergies  ROS Review of Systems  Constitutional:  Negative for chills and fever.  HENT:  Positive for hearing loss. Negative for congestion and sore throat.   Eyes:  Negative for pain and  discharge.  Respiratory:  Negative for cough and shortness of breath.   Cardiovascular:  Negative for chest pain and palpitations.  Gastrointestinal:  Positive for constipation. Negative for diarrhea, nausea and vomiting.  Endocrine: Negative for polydipsia and polyuria.  Genitourinary:  Negative for dysuria and hematuria.  Musculoskeletal:  Positive for gait problem. Negative for neck pain and neck stiffness.  Skin:  Negative for rash.  Neurological:  Negative  for dizziness, weakness, numbness and headaches.  Psychiatric/Behavioral:  Negative for agitation and behavioral problems. The patient is nervous/anxious.       Objective:    Physical Exam Vitals reviewed.  Constitutional:      General: He is not in acute distress.    Appearance: He is not diaphoretic.  HENT:     Head: Normocephalic and atraumatic.     Nose: Nose normal.     Mouth/Throat:     Mouth: Mucous membranes are moist.  Eyes:     General: No scleral icterus.    Extraocular Movements: Extraocular movements intact.  Cardiovascular:     Rate and Rhythm: Normal rate and regular rhythm.     Pulses: Normal pulses.     Heart sounds: Normal heart sounds. No murmur heard. Pulmonary:     Breath sounds: Normal breath sounds. No wheezing or rales.  Musculoskeletal:     Cervical back: Neck supple. No tenderness.     Right lower leg: No edema.     Left lower leg: No edema.  Skin:    General: Skin is warm.     Findings: Rash (Seborrheic keratosis on forearms - papular rash) present.  Neurological:     General: No focal deficit present.     Mental Status: He is alert and oriented to person, place, and time.     Motor: Weakness (RLE - 4/5) present.     Comments: MMSE: 26  Psychiatric:        Mood and Affect: Mood normal.        Behavior: Behavior normal.     BP 134/62 (BP Location: Left Arm)   Pulse 68   Ht 6' (1.829 m)   Wt 212 lb 9.6 oz (96.4 kg)   SpO2 91%   BMI 28.83 kg/m  Wt Readings from Last 3  Encounters:  01/02/23 212 lb 9.6 oz (96.4 kg)  10/23/22 200 lb (90.7 kg)  09/06/22 204 lb 9.6 oz (92.8 kg)    Lab Results  Component Value Date   TSH 4.500 12/25/2022   Lab Results  Component Value Date   WBC 8.1 12/25/2022   HGB 15.0 12/25/2022   HCT 44.8 12/25/2022   MCV 95 12/25/2022   PLT 201 12/25/2022   Lab Results  Component Value Date   NA 140 12/25/2022   K 4.7 12/25/2022   CO2 23 12/25/2022   GLUCOSE 133 (H) 12/25/2022   BUN 19 12/25/2022   CREATININE 1.17 12/25/2022   BILITOT 0.6 12/25/2022   ALKPHOS 75 12/25/2022   AST 24 12/25/2022   ALT 15 12/25/2022   PROT 7.0 12/25/2022   ALBUMIN 4.2 12/25/2022   CALCIUM 9.8 12/25/2022   ANIONGAP 7 05/20/2021   EGFR 60 12/25/2022   Lab Results  Component Value Date   CHOL 159 12/26/2021   Lab Results  Component Value Date   HDL 42 12/26/2021   Lab Results  Component Value Date   LDLCALC 100 (H) 12/26/2021   Lab Results  Component Value Date   TRIG 91 12/26/2021   Lab Results  Component Value Date   CHOLHDL 3.8 12/26/2021   Lab Results  Component Value Date   HGBA1C 7.0 (H) 12/25/2022      Assessment & Plan:   Problem List Items Addressed This Visit       Cardiovascular and Mediastinum   Essential hypertension, benign   BP Readings from Last 1 Encounters:  01/02/23 134/62   Usually well-controlled with Lisinopril Counseled for  compliance with the medications Advised DASH diet and moderate exercise/walking as tolerated      Relevant Medications   lisinopril (ZESTRIL) 5 MG tablet   LAFB (left anterior fascicular block)   Previous EKG showed LAFB in 2023 He had 2 episodes of altered mentation, could be related to dementia EKG: Sinus rhythm. LAFB. No signs of active ischemia.      Relevant Medications   lisinopril (ZESTRIL) 5 MG tablet   Other Relevant Orders   EKG 12-Lead (Completed)     Endocrine   Type 2 diabetes mellitus with other specified complication (HCC) - Primary   Lab  Results  Component Value Date   HGBA1C 7.0 (H) 12/25/2022   Associated with HTN and HLD Diet controlled currently DCed glipizide in the past to avoid hypoglycemia - if persistent elevation, will add Glipizide at a lower dose Advised to follow diabetic diet On statin and ACEi Reviewed CMP and lipid panel Diabetic eye exam: Advised to follow up with Ophthalmology for diabetic eye exam      Relevant Medications   lisinopril (ZESTRIL) 5 MG tablet   Other Relevant Orders   Basic Metabolic Panel (BMET)   Hemoglobin A1c   Hypothyroidism   Lab Results  Component Value Date   TSH 4.500 12/25/2022   On levothyroxine 88 mcg As TSH improved, will continue same dose of Levothyroxine for now Check TSH and free T4      Relevant Medications   levothyroxine (SYNTHROID) 88 MCG tablet   Other Relevant Orders   TSH + free T4     Nervous and Auditory   Mild Alzheimer's dementia without behavioral disturbance, psychotic disturbance, mood disturbance, or anxiety (HCC)   MMSE: 26 in 08/24 Has mild cognitive decline with short-term memory loss On donepezil 5 mg at bedtime, increased dose to 10 mg at bedtime Advised to wear hearing aids as hearing loss can impair cognition as well      Relevant Medications   donepezil (ARICEPT) 10 MG tablet     Genitourinary   BPH (benign prostatic hyperplasia)   Well controlled currently with tamsulosin      Relevant Medications   tamsulosin (FLOMAX) 0.4 MG CAPS capsule   Stage 3a chronic kidney disease (HCC)   Last BMP reviewed, GFR improved to 60, GFR usually stable around 50 Maintain adequate hydration Avoid nephrotoxic agents        Other   Gout   Relevant Medications   allopurinol (ZYLOPRIM) 100 MG tablet     Meds ordered this encounter  Medications   lisinopril (ZESTRIL) 5 MG tablet    Sig: Take 1 tablet (5 mg total) by mouth daily.    Dispense:  90 tablet    Refill:  1   levothyroxine (SYNTHROID) 88 MCG tablet    Sig: Take 1  tablet (88 mcg total) by mouth daily.    Dispense:  90 tablet    Refill:  1   tamsulosin (FLOMAX) 0.4 MG CAPS capsule    Sig: Take 2 capsules (0.8 mg total) by mouth daily.    Dispense:  180 capsule    Refill:  3   allopurinol (ZYLOPRIM) 100 MG tablet    Sig: Take 1 tablet (100 mg total) by mouth daily.    Dispense:  90 tablet    Refill:  1   donepezil (ARICEPT) 10 MG tablet    Sig: Take 1 tablet (10 mg total) by mouth at bedtime.    Dispense:  90 tablet  Refill:  1    Please discontinue Donepezil 5 mg dose.    Follow-up: Return in about 4 months (around 05/03/2023) for DM and CKD.    Anabel Halon, MD

## 2023-01-02 NOTE — Assessment & Plan Note (Addendum)
MMSE: 26 in 08/24 Has mild cognitive decline with short-term memory loss On donepezil 5 mg at bedtime, increased dose to 10 mg at bedtime Advised to wear hearing aids as hearing loss can impair cognition as well

## 2023-01-02 NOTE — Assessment & Plan Note (Addendum)
Previous EKG showed LAFB in 2023 He had 2 episodes of altered mentation, could be related to dementia EKG: Sinus rhythm. LAFB. No signs of active ischemia.

## 2023-01-02 NOTE — Assessment & Plan Note (Signed)
Last BMP reviewed, GFR improved to 60, GFR usually stable around 50 Maintain adequate hydration Avoid nephrotoxic agents

## 2023-01-02 NOTE — Assessment & Plan Note (Signed)
Lab Results  Component Value Date   TSH 4.500 12/25/2022   On levothyroxine 88 mcg As TSH improved, will continue same dose of Levothyroxine for now Check TSH and free T4

## 2023-01-02 NOTE — Assessment & Plan Note (Addendum)
BP Readings from Last 1 Encounters:  01/02/23 134/62   Usually well-controlled with Lisinopril Counseled for compliance with the medications Advised DASH diet and moderate exercise/walking as tolerated

## 2023-01-02 NOTE — Assessment & Plan Note (Addendum)
Lab Results  Component Value Date   HGBA1C 7.0 (H) 12/25/2022   Associated with HTN and HLD Diet controlled currently DCed glipizide in the past to avoid hypoglycemia - if persistent elevation, will add Glipizide at a lower dose Advised to follow diabetic diet On statin and ACEi Reviewed CMP and lipid panel Diabetic eye exam: Advised to follow up with Ophthalmology for diabetic eye exam

## 2023-01-02 NOTE — Patient Instructions (Addendum)
Please start taking Donepezil 10 mg instead of 5 mg.  Please continue to take other medications as prescribed.  Please continue to follow low carb diet and ambulate as tolerated.

## 2023-01-02 NOTE — Assessment & Plan Note (Signed)
Well controlled currently with tamsulosin

## 2023-04-24 DIAGNOSIS — E039 Hypothyroidism, unspecified: Secondary | ICD-10-CM | POA: Diagnosis not present

## 2023-04-24 DIAGNOSIS — E1169 Type 2 diabetes mellitus with other specified complication: Secondary | ICD-10-CM | POA: Diagnosis not present

## 2023-04-25 LAB — BASIC METABOLIC PANEL WITH GFR
BUN/Creatinine Ratio: 12 (ref 10–24)
BUN: 16 mg/dL (ref 8–27)
CO2: 24 mmol/L (ref 20–29)
Calcium: 9.4 mg/dL (ref 8.6–10.2)
Chloride: 104 mmol/L (ref 96–106)
Creatinine, Ser: 1.3 mg/dL — ABNORMAL HIGH (ref 0.76–1.27)
Glucose: 120 mg/dL — ABNORMAL HIGH (ref 70–99)
Potassium: 4.4 mmol/L (ref 3.5–5.2)
Sodium: 142 mmol/L (ref 134–144)
eGFR: 53 mL/min/{1.73_m2} — ABNORMAL LOW (ref >59)

## 2023-04-25 LAB — TSH+FREE T4
Free T4: 1.28 ng/dL (ref 0.82–1.77)
TSH: 4.29 u[IU]/mL (ref 0.450–4.500)

## 2023-04-25 LAB — HEMOGLOBIN A1C
Est. average glucose Bld gHb Est-mCnc: 151 mg/dL
Hgb A1c MFr Bld: 6.9 % — ABNORMAL HIGH (ref 4.8–5.6)

## 2023-05-03 ENCOUNTER — Encounter: Payer: Self-pay | Admitting: Internal Medicine

## 2023-05-03 ENCOUNTER — Ambulatory Visit (INDEPENDENT_AMBULATORY_CARE_PROVIDER_SITE_OTHER): Payer: Medicare Other | Admitting: Internal Medicine

## 2023-05-03 VITALS — BP 125/70 | HR 95 | Ht 72.0 in | Wt 213.6 lb

## 2023-05-03 DIAGNOSIS — I10 Essential (primary) hypertension: Secondary | ICD-10-CM

## 2023-05-03 DIAGNOSIS — Z0001 Encounter for general adult medical examination with abnormal findings: Secondary | ICD-10-CM | POA: Diagnosis not present

## 2023-05-03 DIAGNOSIS — G309 Alzheimer's disease, unspecified: Secondary | ICD-10-CM

## 2023-05-03 DIAGNOSIS — F02A0 Dementia in other diseases classified elsewhere, mild, without behavioral disturbance, psychotic disturbance, mood disturbance, and anxiety: Secondary | ICD-10-CM

## 2023-05-03 DIAGNOSIS — E039 Hypothyroidism, unspecified: Secondary | ICD-10-CM

## 2023-05-03 DIAGNOSIS — N1831 Chronic kidney disease, stage 3a: Secondary | ICD-10-CM

## 2023-05-03 DIAGNOSIS — E1169 Type 2 diabetes mellitus with other specified complication: Secondary | ICD-10-CM | POA: Diagnosis not present

## 2023-05-03 NOTE — Patient Instructions (Signed)
 Please continue to take medications as prescribed.  Please continue to follow low carb diet and ambulate as tolerated.  Please use cane especially when outdoors.  Please get fasting blood tests done before the next visit.

## 2023-05-03 NOTE — Assessment & Plan Note (Signed)
 BP Readings from Last 1 Encounters:  05/03/23 125/70   Usually well-controlled with Lisinopril Counseled for compliance with the medications Advised DASH diet and moderate exercise/walking as tolerated

## 2023-05-03 NOTE — Assessment & Plan Note (Signed)
Physical exam as documented. Fasting blood tests reviewed. 

## 2023-05-03 NOTE — Assessment & Plan Note (Signed)
 Lab Results  Component Value Date   TSH 4.290 04/24/2023   On levothyroxine 88 mcg As TSH improved, will continue same dose of Levothyroxine for now Check TSH and free T4

## 2023-05-03 NOTE — Assessment & Plan Note (Signed)
 MMSE: 26 in 08/24 Has mild cognitive decline with short-term memory loss On donepezil 10 mg at bedtime Advised to wear hearing aids as hearing loss can impair cognition as well

## 2023-05-03 NOTE — Assessment & Plan Note (Addendum)
 Last BMP reviewed, GFR stable at 53, GFR usually stable around 50 Maintain adequate hydration Avoid nephrotoxic agents

## 2023-05-03 NOTE — Progress Notes (Signed)
 Established Patient Office Visit  Subjective:  Patient ID: Daniel Mercado, male    DOB: 11-15-1931  Age: 89 y.o. MRN: [id number]  CC:  Chief Complaint  Patient presents with   Medical Management of Chronic Issues    4 month f/u    HPI Daniel Mercado is a 89 y.o. male with past medical history of DM, BPH, DDD of lumbar spine s/p disc repair and gout who presents for annual physical.  His daughter-in-law is present during the visit today.  Dementia: He is on donepezil for dementia. Daughter-in-law does not report any major episode of confusion or agitation recently. Denies any focal neurologic symptoms, such as numbness, tingling or weakness of the UE or LE.  He has not had any episodes of agitation, delusion or hallucinations.  He has a hearing aid now, but has excess earwax due to it.  He does not wear hearing aid regularly.  He lives alone.  His son and daughter-in-law live next-door.  He plays cards with his friends at a nearby store.  His BP was wnl today. BP is well-controlled at home. Takes medications regularly. Patient denies headache, dizziness, chest pain, dyspnea or palpitations.  His HbA1C is 6.9. He used to take Glipizide, which was stopped to avoid hypoglycemia risk. Denies any polyuria or polyphagia. Does not recall trying any other medication for it.  Constipation: He takes MiraLAX as needed, but has stool urgency or loose BM with it.  Denies any melena or hematochezia currently.   Hypothyroidism: His TSH is WNL now. He takes levothyroxine to 88 mcg regularly. He denies any recent change in appetite or weight.  CKD: His S. Cr. and GFR have been stable now.  He admits that he used to drink only 2 glasses of water in a day sometimes, but is trying to improve hydration. Takes soda pops daily.    Past Medical History:  Diagnosis Date   Erectile dysfunction    Gout    History of colon polyps    Left ureteral stone    Nocturia    Peripheral neuropathy    Sciatica    Type  2 diabetes mellitus (HCC)     Past Surgical History:  Procedure Laterality Date   COLONOSCOPY  last one 2009   LUMBAR DISC SURGERY  1995  &  1967    Family History  Problem Relation Age of Onset   Diabetes Other     Social History   Socioeconomic History   Marital status: Married    Spouse name: Not on file   Number of children: Not on file   Years of education: Not on file   Highest education level: Not on file  Occupational History   Not on file  Tobacco Use   Smoking status: Former   Smokeless tobacco: Current    Types: Chew  Substance and Sexual Activity   Alcohol use: No   Drug use: No   Sexual activity: Not on file  Other Topics Concern   Not on file  Social History Narrative   Not on file   Social Drivers of Health   Financial Resource Strain: Low Risk  (10/23/2022)   Overall Financial Resource Strain (CARDIA)    Difficulty of Paying Living Expenses: Not hard at all  Food Insecurity: No Food Insecurity (10/23/2022)   Hunger Vital Sign    Worried About Running Out of Food in the Last Year: Never true    Ran Out of Food in the Last  Year: Never true  Transportation Needs: No Transportation Needs (10/23/2022)   PRAPARE - Administrator, Civil Service (Medical): No    Lack of Transportation (Non-Medical): No  Physical Activity: Insufficiently Active (10/23/2022)   Exercise Vital Sign    Days of Exercise per Week: 3 days    Minutes of Exercise per Session: 30 min  Stress: No Stress Concern Present (10/23/2022)   [employer] of Occupational Health - Occupational Stress Questionnaire    Feeling of Stress : Not at all  Social Connections: Moderately Isolated (10/23/2022)   Social Connection and Isolation Panel [NHANES]    Frequency of Communication with Friends and Family: More than three times a week    Frequency of Social Gatherings with Friends and Family: More than three times a week    Attends Religious Services: More than 4 times per year     Active Member of [employer] or Organizations: No    Attends Banker Meetings: Never    Marital Status: Widowed  Intimate Partner Violence: Not At Risk (10/23/2022)   Humiliation, Afraid, Rape, and Kick questionnaire    Fear of Current or Ex-Partner: No    Emotionally Abused: No    Physically Abused: No    Sexually Abused: No    Outpatient Medications Prior to Visit  Medication Sig Dispense Refill   allopurinol (ZYLOPRIM) 100 MG tablet Take 1 tablet (100 mg total) by mouth daily. 90 tablet 1   aspirin 81 MG tablet Take 81 mg by mouth daily.     docusate sodium (COLACE) 100 MG capsule Take 1 capsule (100 mg total) by mouth daily as needed for mild constipation. 30 capsule 5   donepezil (ARICEPT) 10 MG tablet Take 1 tablet (10 mg total) by mouth at bedtime. 90 tablet 1   levothyroxine (SYNTHROID) 88 MCG tablet Take 1 tablet (88 mcg total) by mouth daily. 90 tablet 1   lisinopril (ZESTRIL) 5 MG tablet Take 1 tablet (5 mg total) by mouth daily. 90 tablet 1   polyethylene glycol powder (GLYCOLAX/MIRALAX) 17 GM/SCOOP powder Take 17 g by mouth daily as needed for mild constipation or moderate constipation.      tamsulosin (FLOMAX) 0.4 MG CAPS capsule Take 2 capsules (0.8 mg total) by mouth daily. 180 capsule 3   No facility-administered medications prior to visit.    No Known Allergies  ROS Review of Systems  Constitutional:  Negative for chills and fever.  HENT:  Positive for hearing loss. Negative for congestion and sore throat.   Eyes:  Negative for pain and discharge.  Respiratory:  Negative for cough and shortness of breath.   Cardiovascular:  Negative for chest pain and palpitations.  Gastrointestinal:  Positive for constipation. Negative for diarrhea, nausea and vomiting.  Endocrine: Negative for polydipsia and polyuria.  Genitourinary:  Negative for dysuria and hematuria.  Musculoskeletal:  Positive for gait problem. Negative for neck pain and neck stiffness.  Skin:   Negative for rash.  Neurological:  Negative for dizziness, weakness, numbness and headaches.  Psychiatric/Behavioral:  Negative for agitation and behavioral problems. The patient is nervous/anxious.       Objective:    Physical Exam Vitals reviewed.  Constitutional:      General: He is not in acute distress.    Appearance: He is not diaphoretic.  HENT:     Head: Normocephalic and atraumatic.     Nose: Nose normal.     Mouth/Throat:     Mouth: Mucous membranes are moist.  Eyes:     General: No scleral icterus.    Extraocular Movements: Extraocular movements intact.  Cardiovascular:     Rate and Rhythm: Normal rate and regular rhythm.     Pulses: Normal pulses.     Heart sounds: Normal heart sounds. No murmur heard. Pulmonary:     Breath sounds: Normal breath sounds. No wheezing or rales.  Musculoskeletal:     Cervical back: Neck supple. No tenderness.     Right lower leg: No edema.     Left lower leg: No edema.  Skin:    General: Skin is warm.     Findings: Rash (Seborrheic keratosis on forearms - papular rash) present.  Neurological:     General: No focal deficit present.     Mental Status: He is alert and oriented to person, place, and time.     Motor: Weakness (RLE - 4/5) present.     Comments: MMSE: 26  Psychiatric:        Mood and Affect: Mood normal.        Behavior: Behavior normal.     BP 125/70   Pulse 95   Ht 6' (1.829 m)   Wt 213 lb 9.6 oz (96.9 kg)   SpO2 93%   BMI 28.97 kg/m  Wt Readings from Last 3 Encounters:  05/03/23 213 lb 9.6 oz (96.9 kg)  01/02/23 212 lb 9.6 oz (96.4 kg)  10/23/22 200 lb (90.7 kg)    Lab Results  Component Value Date   TSH 4.290 04/24/2023   Lab Results  Component Value Date   WBC 8.1 12/25/2022   HGB 15.0 12/25/2022   HCT 44.8 12/25/2022   MCV 95 12/25/2022   PLT 201 12/25/2022   Lab Results  Component Value Date   NA 142 04/24/2023   K 4.4 04/24/2023   CO2 24 04/24/2023   GLUCOSE 120 (H) 04/24/2023   BUN  16 04/24/2023   CREATININE 1.30 (H) 04/24/2023   BILITOT 0.6 12/25/2022   ALKPHOS 75 12/25/2022   AST 24 12/25/2022   ALT 15 12/25/2022   PROT 7.0 12/25/2022   ALBUMIN 4.2 12/25/2022   CALCIUM 9.4 04/24/2023   ANIONGAP 7 05/20/2021   EGFR 53 (L) 04/24/2023   Lab Results  Component Value Date   CHOL 159 12/26/2021   Lab Results  Component Value Date   HDL 42 12/26/2021   Lab Results  Component Value Date   LDLCALC 100 (H) 12/26/2021   Lab Results  Component Value Date   TRIG 91 12/26/2021   Lab Results  Component Value Date   CHOLHDL 3.8 12/26/2021   Lab Results  Component Value Date   HGBA1C 6.9 (H) 04/24/2023      Assessment & Plan:   Problem List Items Addressed This Visit       Cardiovascular and Mediastinum   Essential hypertension, benign   BP Readings from Last 1 Encounters:  05/03/23 125/70   Usually well-controlled with Lisinopril Counseled for compliance with the medications Advised DASH diet and moderate exercise/walking as tolerated        Endocrine   Type 2 diabetes mellitus with other specified complication (HCC)   Lab Results  Component Value Date   HGBA1C 6.9 (H) 04/24/2023   Associated with HTN and HLD Diet controlled currently DCed glipizide in the past to avoid hypoglycemia - if persistent elevation, will add Glipizide at a lower dose Advised to follow diabetic diet On statin and ACEi Reviewed CMP and lipid panel Diabetic  eye exam: Advised to follow up with Ophthalmology for diabetic eye exam      Relevant Orders   CMP14+EGFR   Hemoglobin A1c   Hypothyroidism   Lab Results  Component Value Date   TSH 4.290 04/24/2023   On levothyroxine 88 mcg As TSH improved, will continue same dose of Levothyroxine for now Check TSH and free T4      Relevant Orders   TSH + free T4     Nervous and Auditory   Mild Alzheimer's dementia without behavioral disturbance, psychotic disturbance, mood disturbance, or anxiety (HCC)   MMSE:  26 in 08/24 Has mild cognitive decline with short-term memory loss On donepezil 10 mg at bedtime Advised to wear hearing aids as hearing loss can impair cognition as well        Genitourinary   Stage 3a chronic kidney disease (HCC)   Last BMP reviewed, GFR stable at 53, GFR usually stable around 50 Maintain adequate hydration Avoid nephrotoxic agents      Relevant Orders   CBC with Differential/Platelet   CMP14+EGFR     Other   Encounter for general adult medical examination with abnormal findings - Primary   Physical exam as documented. Fasting blood tests reviewed.         No orders of the defined types were placed in this encounter.   Follow-up: Return in about 6 months (around 11/02/2023) for DM and CKD.    Meldon Sport, MD

## 2023-05-03 NOTE — Assessment & Plan Note (Signed)
 Lab Results  Component Value Date   HGBA1C 6.9 (H) 04/24/2023   Associated with HTN and HLD Diet controlled currently DCed glipizide in the past to avoid hypoglycemia - if persistent elevation, will add Glipizide at a lower dose Advised to follow diabetic diet On statin and ACEi Reviewed CMP and lipid panel Diabetic eye exam: Advised to follow up with Ophthalmology for diabetic eye exam

## 2023-08-15 ENCOUNTER — Other Ambulatory Visit: Payer: Self-pay | Admitting: Internal Medicine

## 2023-08-15 DIAGNOSIS — F02A0 Dementia in other diseases classified elsewhere, mild, without behavioral disturbance, psychotic disturbance, mood disturbance, and anxiety: Secondary | ICD-10-CM

## 2023-08-15 DIAGNOSIS — E039 Hypothyroidism, unspecified: Secondary | ICD-10-CM

## 2023-10-16 ENCOUNTER — Other Ambulatory Visit: Payer: Self-pay | Admitting: Internal Medicine

## 2023-10-16 DIAGNOSIS — M1000 Idiopathic gout, unspecified site: Secondary | ICD-10-CM

## 2023-10-16 DIAGNOSIS — I10 Essential (primary) hypertension: Secondary | ICD-10-CM

## 2023-10-19 DIAGNOSIS — E039 Hypothyroidism, unspecified: Secondary | ICD-10-CM | POA: Diagnosis not present

## 2023-10-19 DIAGNOSIS — E1169 Type 2 diabetes mellitus with other specified complication: Secondary | ICD-10-CM | POA: Diagnosis not present

## 2023-10-19 DIAGNOSIS — N1831 Chronic kidney disease, stage 3a: Secondary | ICD-10-CM | POA: Diagnosis not present

## 2023-10-20 LAB — CBC WITH DIFFERENTIAL/PLATELET
Basophils Absolute: 0.1 x10E3/uL (ref 0.0–0.2)
Basos: 1 %
EOS (ABSOLUTE): 0.2 x10E3/uL (ref 0.0–0.4)
Eos: 2 %
Hematocrit: 46.4 % (ref 37.5–51.0)
Hemoglobin: 15.4 g/dL (ref 13.0–17.7)
Immature Grans (Abs): 0 x10E3/uL (ref 0.0–0.1)
Immature Granulocytes: 0 %
Lymphocytes Absolute: 2.4 x10E3/uL (ref 0.7–3.1)
Lymphs: 33 %
MCH: 32.4 pg (ref 26.6–33.0)
MCHC: 33.2 g/dL (ref 31.5–35.7)
MCV: 98 fL — ABNORMAL HIGH (ref 79–97)
Monocytes Absolute: 0.6 x10E3/uL (ref 0.1–0.9)
Monocytes: 8 %
Neutrophils Absolute: 4 x10E3/uL (ref 1.4–7.0)
Neutrophils: 56 %
Platelets: 194 x10E3/uL (ref 150–450)
RBC: 4.76 x10E6/uL (ref 4.14–5.80)
RDW: 13.2 % (ref 11.6–15.4)
WBC: 7.2 x10E3/uL (ref 3.4–10.8)

## 2023-10-20 LAB — HEMOGLOBIN A1C
Est. average glucose Bld gHb Est-mCnc: 148 mg/dL
Hgb A1c MFr Bld: 6.8 % — ABNORMAL HIGH (ref 4.8–5.6)

## 2023-10-20 LAB — CMP14+EGFR
ALT: 20 IU/L (ref 0–44)
AST: 30 IU/L (ref 0–40)
Albumin: 4.3 g/dL (ref 3.7–4.7)
Alkaline Phosphatase: 75 IU/L (ref 48–129)
BUN/Creatinine Ratio: 14 (ref 10–24)
BUN: 20 mg/dL (ref 8–27)
Bilirubin Total: 0.8 mg/dL (ref 0.0–1.2)
CO2: 25 mmol/L (ref 20–29)
Calcium: 9.8 mg/dL (ref 8.6–10.2)
Chloride: 102 mmol/L (ref 96–106)
Creatinine, Ser: 1.42 mg/dL — ABNORMAL HIGH (ref 0.76–1.27)
Globulin, Total: 2.6 g/dL (ref 1.5–4.5)
Glucose: 115 mg/dL — ABNORMAL HIGH (ref 70–99)
Potassium: 4.5 mmol/L (ref 3.5–5.2)
Sodium: 142 mmol/L (ref 134–144)
Total Protein: 6.9 g/dL (ref 6.0–8.5)
eGFR: 47 mL/min/1.73 — ABNORMAL LOW (ref >59)

## 2023-10-20 LAB — TSH+FREE T4
Free T4: 1.38 ng/dL (ref 0.82–1.77)
TSH: 3.95 u[IU]/mL (ref 0.450–4.500)

## 2023-10-24 ENCOUNTER — Ambulatory Visit: Payer: Medicare Other

## 2023-10-24 VITALS — Ht 72.0 in | Wt 213.0 lb

## 2023-10-24 DIAGNOSIS — Z0000 Encounter for general adult medical examination without abnormal findings: Secondary | ICD-10-CM | POA: Diagnosis not present

## 2023-10-24 NOTE — Patient Instructions (Signed)
 Mr. Daniel Mercado,  Thank you for taking the time for your Medicare Wellness Visit. I appreciate your continued commitment to your health goals. Please review the care plan we discussed, and feel free to reach out if I can assist you further.  Medicare recommends these wellness visits once per year to help you and your care team stay ahead of potential health issues. These visits are designed to focus on prevention, allowing your provider to concentrate on managing your acute and chronic conditions during your regular appointments.  Please note that Annual Wellness Visits do not include a physical exam. Some assessments may be limited, especially if the visit was conducted virtually. If needed, we may recommend a separate in-person follow-up with your provider.  Wishing you excellent health and many blessings in the year to come!  -Ohm Dentler, CMA  Ongoing Care Seeing your primary care provider every 3 to 6 months helps us  monitor your health and provide consistent, personalized care.   Recommended Screenings:  Health Maintenance  Topic Date Due   Eye exam for diabetics  05/18/2023   Flu Shot  08/17/2023   COVID-19 Vaccine (2 - 2025-26 season) 09/17/2023   Hemoglobin A1C  04/18/2024   Complete foot exam   05/02/2024   Medicare Annual Wellness Visit  10/23/2024   DTaP/Tdap/Td vaccine (2 - Td or Tdap) 11/15/2025   Pneumococcal Vaccine for age over 37  Completed   Zoster (Shingles) Vaccine  Completed   Meningitis B Vaccine  Aged Out       10/24/2023   10:43 AM  Advanced Directives  Does Patient Have a Medical Advance Directive? No  Would patient like information on creating a medical advance directive? No - Patient declined   Advance Care Planning is important because it: Ensures you receive medical care that aligns with your values, goals, and preferences. Provides guidance to your family and loved ones, reducing the emotional burden of decision-making during critical moments.  Vision: Annual  vision screenings are recommended for early detection of glaucoma, cataracts, and diabetic retinopathy. These exams can also reveal signs of chronic conditions such as diabetes and high blood pressure.  Dental: Annual dental screenings help detect early signs of oral cancer, gum disease, and other conditions linked to overall health, including heart disease and diabetes.  Please see the attached documents for additional preventive care recommendations.

## 2023-10-24 NOTE — Progress Notes (Signed)
 Patient due for flu vaccine. Has follow up visit on 11/02/2023 Subjective:   Daniel Mercado is a 89 y.o. who presents for a Medicare Wellness preventive visit.  As a reminder, Annual Wellness Visits don't include a physical exam, and some assessments may be limited, especially if this visit is performed virtually. We may recommend an in-person follow-up visit with your provider if needed.  Visit Complete: Virtual I connected with  Daniel Mercado on 10/24/23 by a audio enabled telemedicine application and verified that I am speaking with the correct person using two identifiers.  Patient Location: Home  Provider Location: Home Office  I discussed the limitations of evaluation and management by telemedicine. The patient expressed understanding and agreed to proceed.  Vital Signs: Because this visit was a virtual/telehealth visit, some criteria may be missing or patient reported. Any vitals not documented were not able to be obtained and vitals that have been documented are patient reported.  VideoDeclined- This patient declined Librarian, academic. Therefore the visit was completed with audio only.  Persons Participating in Visit: Patient assisted by daughter in law Woodman.  AWV Questionnaire: No: Patient Medicare AWV questionnaire was not completed prior to this visit.  Cardiac Risk Factors include: advanced age (>70men, >37 women);diabetes mellitus;male gender;smoking/ tobacco exposure     Objective:    Unable to obtain blood pressure. Telehealth visit.  Today's Vitals   10/24/23 1038  Weight: 213 lb (96.6 kg)  Height: 6' (1.829 m)   Body mass index is 28.89 kg/m.     10/24/2023   10:43 AM 10/23/2022   10:36 AM 05/20/2021    8:28 PM 09/19/2020    8:07 AM 11/06/2014    9:32 AM 10/09/2014    3:35 PM  Advanced Directives  Does Patient Have a Medical Advance Directive? No Yes No Yes Yes  Yes   Type of Mudlogger;Living will  Living will Living will  Healthcare Power of Attorney   Does patient want to make changes to medical advance directive?    No - Patient declined    Copy of Healthcare Power of Attorney in Chart?  No - copy requested   No - copy requested    Would patient like information on creating a medical advance directive? No - Patient declined    No - patient declined information       Data saved with a previous flowsheet row definition    Current Medications (verified) Outpatient Encounter Medications as of 10/24/2023  Medication Sig   allopurinol  (ZYLOPRIM ) 100 MG tablet Take 1 tablet by mouth once daily   aspirin 81 MG tablet Take 81 mg by mouth daily.   docusate sodium  (COLACE) 100 MG capsule Take 1 capsule (100 mg total) by mouth daily as needed for mild constipation.   donepezil  (ARICEPT ) 10 MG tablet TAKE 1 TABLET BY MOUTH AT BEDTIME   levothyroxine  (SYNTHROID ) 88 MCG tablet Take 1 tablet by mouth once daily   lisinopril  (ZESTRIL ) 5 MG tablet Take 1 tablet by mouth once daily   polyethylene glycol powder (GLYCOLAX/MIRALAX) 17 GM/SCOOP powder Take 17 g by mouth daily as needed for mild constipation or moderate constipation.    tamsulosin  (FLOMAX ) 0.4 MG CAPS capsule Take 2 capsules (0.8 mg total) by mouth daily.   No facility-administered encounter medications on file as of 10/24/2023.    Allergies (verified) Patient has no known allergies.   History: Past Medical History:  Diagnosis Date  Erectile dysfunction    Gout    History of colon polyps    Left ureteral stone    Nocturia    Peripheral neuropathy    Sciatica    Type 2 diabetes mellitus (HCC)    Past Surgical History:  Procedure Laterality Date   COLONOSCOPY  last one 2009   LUMBAR DISC SURGERY  1995  &  1967   Family History  Problem Relation Age of Onset   Diabetes Other    Social History   Socioeconomic History   Marital status: Married    Spouse name: Not on file   Number of children: Not  on file   Years of education: Not on file   Highest education level: Not on file  Occupational History   Not on file  Tobacco Use   Smoking status: Former   Smokeless tobacco: Current    Types: Chew  Substance and Sexual Activity   Alcohol use: No   Drug use: No   Sexual activity: Not on file  Other Topics Concern   Not on file  Social History Narrative   Not on file   Social Drivers of Health   Financial Resource Strain: Low Risk  (10/24/2023)   Overall Financial Resource Strain (CARDIA)    Difficulty of Paying Living Expenses: Not hard at all  Food Insecurity: No Food Insecurity (10/24/2023)   Hunger Vital Sign    Worried About Running Out of Food in the Last Year: Never true    Ran Out of Food in the Last Year: Never true  Transportation Needs: No Transportation Needs (10/24/2023)   PRAPARE - Administrator, Civil Service (Medical): No    Lack of Transportation (Non-Medical): No  Physical Activity: Sufficiently Active (10/24/2023)   Exercise Vital Sign    Days of Exercise per Week: 7 days    Minutes of Exercise per Session: 30 min  Stress: No Stress Concern Present (10/24/2023)   [employer] of Occupational Health - Occupational Stress Questionnaire    Feeling of Stress: Not at all  Social Connections: Moderately Isolated (10/24/2023)   Social Connection and Isolation Panel    Frequency of Communication with Friends and Family: More than three times a week    Frequency of Social Gatherings with Friends and Family: More than three times a week    Attends Religious Services: More than 4 times per year    Active Member of [employer] or Organizations: No    Attends Banker Meetings: Never    Marital Status: Widowed    Tobacco Counseling Ready to quit: No Counseling given: Yes    Clinical Intake:  Pre-visit preparation completed: Yes  Pain : No/denies pain     BMI - recorded: 28.89 Nutritional Status: BMI 25 -29 Overweight Nutritional  Risks: None Diabetes: Yes CBG done?: No (telehealth visit) Did pt. bring in CBG monitor from home?: No  Lab Results  Component Value Date   HGBA1C 6.8 (H) 10/19/2023   HGBA1C 6.9 (H) 04/24/2023   HGBA1C 7.0 (H) 12/25/2022     How often do you need to have someone help you when you read instructions, pamphlets, or other written materials from your doctor or pharmacy?: 1 - Never  Interpreter Needed?: No  Information entered by :: Dan Dissinger W CMA (AAMA)   Activities of Daily Living     10/24/2023   10:43 AM  In your present state of health, do you have any difficulty performing the following activities:  Hearing? 1  Vision? 0  Difficulty concentrating or making decisions? 0  Walking or climbing stairs? 0  Dressing or bathing? 0  Doing errands, shopping? 0  Preparing Food and eating ? Y  Comment daughter in law fixes meals  Using the Toilet? N  In the past six months, have you accidently leaked urine? N  Do you have problems with loss of bowel control? N  Managing your Medications? Y  Comment daughter in law manages meds  Managing your Finances? Y  Comment daughter in Training and development officer or managing your Housekeeping? Y  Comment a cleaning service comes in twice monthly    Patient Care Team: Tobie Suzzane POUR, MD as PCP - General (Internal Medicine) Patient's daughter in law Welch mailed a letter containing a list of eye doctors to call and see if they are accepting new patients.   I have updated your Care Teams any recent Medical Services you may have received from other providers in the past year.     Assessment:   This is a routine wellness examination for Daniel Mercado.  Hearing/Vision screen Hearing Screening - Comments:: Patient has difficulty hearing. He refuses hearing aids.  Vision Screening - Comments:: Patient does not have an eye doctor. A list of eye doctors has been provided to the patient.     Goals Addressed               This Visit's  Progress     Remain active and healthy and continue to work on being able to walk with less pain. (pt-stated)        Patient completed physical therapy for his knee. Doing better.         Depression Screen     10/24/2023    7:25 PM 05/03/2023    8:31 AM 01/02/2023    8:20 AM 10/23/2022   10:35 AM 09/06/2022    8:45 AM 05/03/2022    8:35 AM 01/02/2022    8:36 AM  PHQ 2/9 Scores  PHQ - 2 Score 0 0 0 0 0 2 2  PHQ- 9 Score 0 0 3   6 4      Fall Risk     10/24/2023   10:51 AM 05/03/2023    8:31 AM 01/02/2023    8:18 AM 10/23/2022   10:34 AM 09/06/2022    8:45 AM  Fall Risk   Falls in the past year? 0 0 0 0 0  Number falls in past yr: 0 0 0 0 0  Injury with Fall? 0 0 0 0 0  Risk for fall due to : No Fall Risks;Orthopedic patient No Fall Risks No Fall Risks No Fall Risks   Risk for fall due to: Comment patient rarely has to use cane now since completing physical therapy      Follow up Falls evaluation completed;Education provided;Falls prevention discussed Falls evaluation completed Falls evaluation completed Falls prevention discussed     MEDICARE RISK AT HOME:  Medicare Risk at Home Any stairs in or around the home?: No If so, are there any without handrails?: Yes Home free of loose throw rugs in walkways, pet beds, electrical cords, etc?: Yes Adequate lighting in your home to reduce risk of falls?: Yes Life alert?: No Use of a cane, walker or w/c?: Yes (as needed) Grab bars in the bathroom?: Yes Shower chair or bench in shower?: Yes Elevated toilet seat or a handicapped toilet?: Yes  TIMED UP AND GO:  Was the test  performed?  No  Cognitive Function: Unable: Due to language barrier, hearing or vision limitations or otherdementia    10/24/2023   10:53 AM 05/02/2021    9:12 AM 09/19/2020    8:10 AM 06/03/2019   10:07 AM  MMSE - Mini Mental State Exam  Not completed: Unable to complete  Unable to complete   Orientation to time  4  5  Orientation to Place  5  5   Registration  2  3  Attention/ Calculation  5  3  Recall  2  2  Language- name 2 objects  2    Language- repeat  1    Language- follow 3 step command  3    Language- read & follow direction  1    Write a sentence  1    Copy design  0    Total score  26          10/23/2022   10:37 AM 09/26/2021    4:09 PM 09/19/2020    8:10 AM  6CIT Screen  What Year? 0 points 0 points 0 points  What month? 3 points 0 points 0 points  What time? 0 points 0 points 0 points  Count back from 20 0 points  0 points  Months in reverse 0 points  0 points  Repeat phrase 2 points  0 points  Total Score 5 points  0 points    Immunizations Immunization History  Administered Date(s) Administered   Fluad Quad(high Dose 65+) 11/20/2019, 11/03/2020, 01/02/2022, 11/01/2022   INFLUENZA, HIGH DOSE SEASONAL PF 10/29/2017   Influenza Split 01/02/2013   Influenza,inj,Quad PF,6+ Mos 11/18/2013, 12/24/2014, 12/28/2016   Influenza-Unspecified 11/29/2011, 11/16/2015, 10/22/2017   Moderna Sars-Covid-2 Vaccination 11/10/2019   PNEUMOCOCCAL CONJUGATE-20 09/01/2021, 11/14/2022   Pneumococcal Conjugate-13 01/01/2014   Pneumococcal-Unspecified 01/15/2010   Respiratory Syncytial Virus Vaccine,Recomb Aduvanted(Arexvy) 09/26/2021   Tdap 11/16/2015   Zoster Recombinant(Shingrix) 09/26/2021, 01/12/2022    Screening Tests Health Maintenance  Topic Date Due   OPHTHALMOLOGY EXAM  05/18/2023   Influenza Vaccine  08/17/2023   COVID-19 Vaccine (2 - 2025-26 season) 09/17/2023   HEMOGLOBIN A1C  04/18/2024   FOOT EXAM  05/02/2024   Medicare Annual Wellness (AWV)  10/23/2024   DTaP/Tdap/Td (2 - Td or Tdap) 11/15/2025   Pneumococcal Vaccine: 50+ Years  Completed   Zoster Vaccines- Shingrix  Completed   Meningococcal B Vaccine  Aged Out    Health Maintenance Health Maintenance Due  Topic Date Due   OPHTHALMOLOGY EXAM  05/18/2023   Influenza Vaccine  08/17/2023   COVID-19 Vaccine (2 - 2025-26 season) 09/17/2023    Health Maintenance Items Addressed: Patient will get his flu vaccine at his follow up appointment with primary care on 11/02/2023.   Additional Screening:  Vision Screening: Recommended annual ophthalmology exams for early detection of glaucoma and other disorders of the eye. List of eye doctors provided.   Dental Screening: Recommended annual dental exams for proper oral hygiene  Community Resource Referral / Chronic Care Management: CRR required this visit?  No   CCM required this visit?  No   Plan:    I have personally reviewed and noted the following in the patient's chart:   Medical and social history Use of alcohol, tobacco or illicit drugs  Current medications and supplements including opioid prescriptions. Patient is not currently taking opioid prescriptions. Functional ability and status Nutritional status Physical activity Advanced directives List of other physicians Hospitalizations, surgeries, and ER visits in previous 12 months Vitals  Screenings to include cognitive, depression, and falls Referrals and appointments  In addition, I have reviewed and discussed with patient certain preventive protocols, quality metrics, and best practice recommendations. A written personalized care plan for preventive services as well as general preventive health recommendations were provided to patient.   Jadarion Halbig, CMA   10/24/2023   After Visit Summary: (MyChart) Due to this being a telephonic visit, the after visit summary with patients personalized plan was offered to patient via MyChart   Notes: see note at top of visit

## 2023-11-02 ENCOUNTER — Encounter: Payer: Self-pay | Admitting: Internal Medicine

## 2023-11-02 ENCOUNTER — Ambulatory Visit: Admitting: Internal Medicine

## 2023-11-02 VITALS — BP 126/72 | HR 88 | Ht 72.0 in | Wt 210.0 lb

## 2023-11-02 DIAGNOSIS — R351 Nocturia: Secondary | ICD-10-CM

## 2023-11-02 DIAGNOSIS — E039 Hypothyroidism, unspecified: Secondary | ICD-10-CM

## 2023-11-02 DIAGNOSIS — G309 Alzheimer's disease, unspecified: Secondary | ICD-10-CM | POA: Diagnosis not present

## 2023-11-02 DIAGNOSIS — I10 Essential (primary) hypertension: Secondary | ICD-10-CM

## 2023-11-02 DIAGNOSIS — E1169 Type 2 diabetes mellitus with other specified complication: Secondary | ICD-10-CM | POA: Diagnosis not present

## 2023-11-02 DIAGNOSIS — N1831 Chronic kidney disease, stage 3a: Secondary | ICD-10-CM | POA: Diagnosis not present

## 2023-11-02 DIAGNOSIS — N401 Enlarged prostate with lower urinary tract symptoms: Secondary | ICD-10-CM

## 2023-11-02 DIAGNOSIS — F02A0 Dementia in other diseases classified elsewhere, mild, without behavioral disturbance, psychotic disturbance, mood disturbance, and anxiety: Secondary | ICD-10-CM

## 2023-11-02 DIAGNOSIS — Z23 Encounter for immunization: Secondary | ICD-10-CM

## 2023-11-02 DIAGNOSIS — N400 Enlarged prostate without lower urinary tract symptoms: Secondary | ICD-10-CM | POA: Diagnosis not present

## 2023-11-02 MED ORDER — DONEPEZIL HCL 10 MG PO TABS: 10.0000 mg | ORAL_TABLET | Freq: Every day | ORAL | 3 refills | Status: AC

## 2023-11-02 MED ORDER — LISINOPRIL 5 MG PO TABS: 5.0000 mg | ORAL_TABLET | Freq: Every day | ORAL | 3 refills | Status: AC

## 2023-11-02 MED ORDER — LEVOTHYROXINE SODIUM 88 MCG PO TABS: 88.0000 ug | ORAL_TABLET | Freq: Every day | ORAL | 3 refills | Status: AC

## 2023-11-02 MED ORDER — TAMSULOSIN HCL 0.4 MG PO CAPS: 0.8000 mg | ORAL_CAPSULE | Freq: Every day | ORAL | 3 refills | Status: AC

## 2023-11-02 NOTE — Assessment & Plan Note (Signed)
Well controlled currently with tamsulosin

## 2023-11-02 NOTE — Assessment & Plan Note (Signed)
 Lab Results  Component Value Date   TSH 3.950 10/19/2023   On levothyroxine  88 mcg As TSH improved, will continue same dose of Levothyroxine  for now Check TSH and free T4

## 2023-11-02 NOTE — Assessment & Plan Note (Signed)
 BP Readings from Last 1 Encounters:  11/02/23 126/72   Usually well-controlled with Lisinopril  Counseled for compliance with the medications Advised DASH diet and moderate exercise/walking as tolerated

## 2023-11-02 NOTE — Assessment & Plan Note (Signed)
 Lab Results  Component Value Date   HGBA1C 6.8 (H) 10/19/2023   Associated with HTN and HLD Diet controlled currently DCed glipizide  in the past to avoid hypoglycemia - if persistent elevation, will add Glipizide  at a lower dose Advised to follow diabetic diet On statin and ACEi Reviewed CMP and lipid panel Diabetic eye exam: Advised to follow up with Ophthalmology for diabetic eye exam

## 2023-11-02 NOTE — Patient Instructions (Signed)
 Please take Tylenol arthritis as needed for knee pain.  Please continue to take medications as prescribed.  Please continue to follow low carb diet and perform moderate exercise/walking as tolerated.  Please get fasting blood tests done before the next visit.

## 2023-11-02 NOTE — Assessment & Plan Note (Signed)
 Last CMP reviewed, GFR stable at 47, GFR usually stable around 50 Maintain adequate hydration Avoid nephrotoxic agents On Lisinopril 

## 2023-11-02 NOTE — Assessment & Plan Note (Signed)
 MMSE: 26/30 in 08/24 Has mild cognitive decline with short-term memory loss On donepezil  10 mg at bedtime Advised to wear hearing aids as hearing loss can impair cognition as well

## 2023-11-02 NOTE — Progress Notes (Signed)
 Established Patient Office Visit  Subjective:  Patient ID: Daniel Mercado, male    DOB: 1931/04/09  Age: 89 y.o. MRN: [id number]  CC:  Chief Complaint  Patient presents with   Diabetes    Six month follow up   Chronic Kidney Disease    Six month follow up     HPI Daniel Mercado is a 89 y.o. male with past medical history of DM, BPH, DDD of lumbar spine s/p disc repair and gout who presents for f/u of his chronic medical conditions. His son is present during the visit today.  Dementia: He is on donepezil  for dementia. His son does not report any major episode of confusion or agitation recently. Denies any focal neurologic symptoms, such as numbness, tingling or weakness of the UE or LE.  He has not had any episodes of agitation, delusion or hallucinations.  He has a hearing aid now, but has excess earwax due to it.  He does not wear hearing aid regularly.  He lives alone.  His son and daughter-in-law live next-door.  He used to play cards with his friends at a nearby store, but that has stopped due to his friends' health concerns.  His BP was wnl today. BP is well-controlled at home. Takes medications regularly. Patient denies headache, dizziness, chest pain, dyspnea or palpitations.  His HbA1C is 6.8. He used to take Glipizide , which was stopped to avoid hypoglycemia risk. Denies any polyuria or polyphagia. Does not recall trying any other medication for it.  Constipation: He takes MiraLAX as needed, but has stool urgency or loose BM with it.  Denies any melena or hematochezia currently.   Hypothyroidism: His TSH is WNL now. He takes levothyroxine  to 88 mcg regularly. He denies any recent change in appetite or weight.  CKD: His S. Cr. and GFR have been stable now.  He admits that he used to drink only 2 glasses of water in a day sometimes, but is trying to improve hydration. Takes soda pops daily.    Past Medical History:  Diagnosis Date   Erectile dysfunction    Gout    History of  colon polyps    Left ureteral stone    Nocturia    Peripheral neuropathy    Sciatica    Type 2 diabetes mellitus (HCC)     Past Surgical History:  Procedure Laterality Date   COLONOSCOPY  last one 2009   LUMBAR DISC SURGERY  1995  &  1967    Family History  Problem Relation Age of Onset   Diabetes Other     Social History   Socioeconomic History   Marital status: Married    Spouse name: Not on file   Number of children: Not on file   Years of education: Not on file   Highest education level: Not on file  Occupational History   Not on file  Tobacco Use   Smoking status: Former   Smokeless tobacco: Current    Types: Chew  Substance and Sexual Activity   Alcohol use: No   Drug use: No   Sexual activity: Not on file  Other Topics Concern   Not on file  Social History Narrative   Not on file   Social Drivers of Health   Financial Resource Strain: Low Risk  (10/24/2023)   Overall Financial Resource Strain (CARDIA)    Difficulty of Paying Living Expenses: Not hard at all  Food Insecurity: No Food Insecurity (10/24/2023)   Hunger Vital  Sign    Worried About Programme researcher, broadcasting/film/video in the Last Year: Never true    Ran Out of Food in the Last Year: Never true  Transportation Needs: No Transportation Needs (10/24/2023)   PRAPARE - Administrator, Civil Service (Medical): No    Lack of Transportation (Non-Medical): No  Physical Activity: Sufficiently Active (10/24/2023)   Exercise Vital Sign    Days of Exercise per Week: 7 days    Minutes of Exercise per Session: 30 min  Stress: No Stress Concern Present (10/24/2023)   [employer] of Occupational Health - Occupational Stress Questionnaire    Feeling of Stress: Not at all  Social Connections: Moderately Isolated (10/24/2023)   Social Connection and Isolation Panel    Frequency of Communication with Friends and Family: More than three times a week    Frequency of Social Gatherings with Friends and Family: More  than three times a week    Attends Religious Services: More than 4 times per year    Active Member of [employer] or Organizations: No    Attends Banker Meetings: Never    Marital Status: Widowed  Intimate Partner Violence: Not At Risk (10/24/2023)   Humiliation, Afraid, Rape, and Kick questionnaire    Fear of Current or Ex-Partner: No    Emotionally Abused: No    Physically Abused: No    Sexually Abused: No    Outpatient Medications Prior to Visit  Medication Sig Dispense Refill   allopurinol  (ZYLOPRIM ) 100 MG tablet Take 1 tablet by mouth once daily 90 tablet 0   aspirin 81 MG tablet Take 81 mg by mouth daily.     docusate sodium  (COLACE) 100 MG capsule Take 1 capsule (100 mg total) by mouth daily as needed for mild constipation. 30 capsule 5   polyethylene glycol powder (GLYCOLAX/MIRALAX) 17 GM/SCOOP powder Take 17 g by mouth daily as needed for mild constipation or moderate constipation.      donepezil  (ARICEPT ) 10 MG tablet TAKE 1 TABLET BY MOUTH AT BEDTIME 90 tablet 0   levothyroxine  (SYNTHROID ) 88 MCG tablet Take 1 tablet by mouth once daily 90 tablet 0   lisinopril  (ZESTRIL ) 5 MG tablet Take 1 tablet by mouth once daily 90 tablet 0   tamsulosin  (FLOMAX ) 0.4 MG CAPS capsule Take 2 capsules (0.8 mg total) by mouth daily. 180 capsule 3   No facility-administered medications prior to visit.    No Known Allergies  ROS Review of Systems  Constitutional:  Negative for chills and fever.  HENT:  Positive for hearing loss. Negative for congestion and sore throat.   Eyes:  Negative for pain and discharge.  Respiratory:  Negative for cough and shortness of breath.   Cardiovascular:  Negative for chest pain and palpitations.  Gastrointestinal:  Positive for constipation. Negative for diarrhea, nausea and vomiting.  Endocrine: Negative for polydipsia and polyuria.  Genitourinary:  Negative for dysuria and hematuria.  Musculoskeletal:  Positive for gait problem. Negative for  neck pain and neck stiffness.  Skin:  Negative for rash.  Neurological:  Negative for dizziness, weakness, numbness and headaches.  Psychiatric/Behavioral:  Negative for agitation and behavioral problems. The patient is nervous/anxious.       Objective:    Physical Exam Vitals reviewed.  Constitutional:      General: He is not in acute distress.    Appearance: He is not diaphoretic.  HENT:     Head: Normocephalic and atraumatic.     Nose: Nose  normal.     Mouth/Throat:     Mouth: Mucous membranes are moist.  Eyes:     General: No scleral icterus.    Extraocular Movements: Extraocular movements intact.  Cardiovascular:     Rate and Rhythm: Normal rate and regular rhythm.     Heart sounds: Normal heart sounds. No murmur heard. Pulmonary:     Breath sounds: Normal breath sounds. No wheezing or rales.  Musculoskeletal:     Cervical back: Neck supple. No tenderness.     Right lower leg: No edema.     Left lower leg: No edema.  Skin:    General: Skin is warm.     Findings: Rash (Seborrheic keratosis on forearms - papular rash) present.  Neurological:     General: No focal deficit present.     Mental Status: He is alert and oriented to person, place, and time.     Motor: Weakness (RLE - 4/5) present.     Comments: MMSE: 26  Psychiatric:        Mood and Affect: Mood normal.        Behavior: Behavior normal.     BP 126/72   Pulse 88   Ht 6' (1.829 m)   Wt 210 lb (95.3 kg)   SpO2 97%   BMI 28.48 kg/m  Wt Readings from Last 3 Encounters:  11/02/23 210 lb (95.3 kg)  10/24/23 213 lb (96.6 kg)  05/03/23 213 lb 9.6 oz (96.9 kg)    Lab Results  Component Value Date   TSH 3.950 10/19/2023   Lab Results  Component Value Date   WBC 7.2 10/19/2023   HGB 15.4 10/19/2023   HCT 46.4 10/19/2023   MCV 98 (H) 10/19/2023   PLT 194 10/19/2023   Lab Results  Component Value Date   NA 142 10/19/2023   K 4.5 10/19/2023   CO2 25 10/19/2023   GLUCOSE 115 (H) 10/19/2023    BUN 20 10/19/2023   CREATININE 1.42 (H) 10/19/2023   BILITOT 0.8 10/19/2023   ALKPHOS 75 10/19/2023   AST 30 10/19/2023   ALT 20 10/19/2023   PROT 6.9 10/19/2023   ALBUMIN 4.3 10/19/2023   CALCIUM 9.8 10/19/2023   ANIONGAP 7 05/20/2021   EGFR 47 (L) 10/19/2023   Lab Results  Component Value Date   CHOL 159 12/26/2021   Lab Results  Component Value Date   HDL 42 12/26/2021   Lab Results  Component Value Date   LDLCALC 100 (H) 12/26/2021   Lab Results  Component Value Date   TRIG 91 12/26/2021   Lab Results  Component Value Date   CHOLHDL 3.8 12/26/2021   Lab Results  Component Value Date   HGBA1C 6.8 (H) 10/19/2023      Assessment & Plan:   Problem List Items Addressed This Visit       Cardiovascular and Mediastinum   Essential hypertension, benign - Primary   BP Readings from Last 1 Encounters:  11/02/23 126/72   Usually well-controlled with Lisinopril  Counseled for compliance with the medications Advised DASH diet and moderate exercise/walking as tolerated      Relevant Medications   lisinopril  (ZESTRIL ) 5 MG tablet     Endocrine   Type 2 diabetes mellitus with other specified complication (HCC)   Lab Results  Component Value Date   HGBA1C 6.8 (H) 10/19/2023   Associated with HTN and HLD Diet controlled currently DCed glipizide  in the past to avoid hypoglycemia - if persistent elevation, will add Glipizide   at a lower dose Advised to follow diabetic diet On statin and ACEi Reviewed CMP and lipid panel Diabetic eye exam: Advised to follow up with Ophthalmology for diabetic eye exam      Relevant Medications   lisinopril  (ZESTRIL ) 5 MG tablet   Other Relevant Orders   CMP14+EGFR   Hemoglobin A1c   Hypothyroidism   Lab Results  Component Value Date   TSH 3.950 10/19/2023   On levothyroxine  88 mcg As TSH improved, will continue same dose of Levothyroxine  for now Check TSH and free T4      Relevant Medications   levothyroxine   (SYNTHROID ) 88 MCG tablet   Other Relevant Orders   TSH + free T4     Nervous and Auditory   Mild Alzheimer's dementia without behavioral disturbance, psychotic disturbance, mood disturbance, or anxiety (HCC)   MMSE: 26/30 in 08/24 Has mild cognitive decline with short-term memory loss On donepezil  10 mg at bedtime Advised to wear hearing aids as hearing loss can impair cognition as well      Relevant Medications   donepezil  (ARICEPT ) 10 MG tablet     Genitourinary   BPH (benign prostatic hyperplasia)   Well controlled currently with tamsulosin       Relevant Medications   tamsulosin  (FLOMAX ) 0.4 MG CAPS capsule   Stage 3a chronic kidney disease (HCC)   Last CMP reviewed, GFR stable at 47, GFR usually stable around 50 Maintain adequate hydration Avoid nephrotoxic agents On Lisinopril       Relevant Orders   CMP14+EGFR   CBC with Differential/Platelet   Other Visit Diagnoses       Encounter for immunization       Relevant Orders   Flu vaccine HIGH DOSE PF(Fluzone  Trivalent) (Completed)          Meds ordered this encounter  Medications   levothyroxine  (SYNTHROID ) 88 MCG tablet    Sig: Take 1 tablet (88 mcg total) by mouth daily.    Dispense:  90 tablet    Refill:  3   donepezil  (ARICEPT ) 10 MG tablet    Sig: Take 1 tablet (10 mg total) by mouth at bedtime.    Dispense:  90 tablet    Refill:  3   lisinopril  (ZESTRIL ) 5 MG tablet    Sig: Take 1 tablet (5 mg total) by mouth daily.    Dispense:  90 tablet    Refill:  3   tamsulosin  (FLOMAX ) 0.4 MG CAPS capsule    Sig: Take 2 capsules (0.8 mg total) by mouth daily.    Dispense:  180 capsule    Refill:  3    Follow-up: Return in about 6 months (around 05/02/2024) for DM and CKD.    Suzzane MARLA Blanch, MD

## 2024-01-09 ENCOUNTER — Other Ambulatory Visit: Payer: Self-pay | Admitting: Internal Medicine

## 2024-01-09 DIAGNOSIS — M1000 Idiopathic gout, unspecified site: Secondary | ICD-10-CM

## 2024-05-06 ENCOUNTER — Ambulatory Visit: Admitting: Internal Medicine

## 2024-10-28 ENCOUNTER — Ambulatory Visit
